# Patient Record
Sex: Male | Born: 1965
Health system: Southern US, Community
[De-identification: ages and names within clinical notes are randomized; demographics above are authoritative.]

## PROBLEM LIST (undated history)

## (undated) DIAGNOSIS — F329 Major depressive disorder, single episode, unspecified: Secondary | ICD-10-CM

## (undated) DIAGNOSIS — F419 Anxiety disorder, unspecified: Secondary | ICD-10-CM

## (undated) DIAGNOSIS — F32A Depression, unspecified: Secondary | ICD-10-CM

## (undated) DIAGNOSIS — I10 Essential (primary) hypertension: Secondary | ICD-10-CM

## (undated) DIAGNOSIS — E119 Type 2 diabetes mellitus without complications: Secondary | ICD-10-CM

## (undated) HISTORY — PX: APPENDECTOMY: SHX54

---

## 2005-04-14 ENCOUNTER — Emergency Department: Payer: Self-pay | Admitting: Unknown Physician Specialty

## 2014-06-11 ENCOUNTER — Emergency Department: Payer: Self-pay | Admitting: Emergency Medicine

## 2015-07-24 ENCOUNTER — Emergency Department: Payer: Self-pay

## 2015-07-24 ENCOUNTER — Emergency Department
Admission: EM | Admit: 2015-07-24 | Discharge: 2015-07-24 | Disposition: A | Discharge status: Home / Self Care | Payer: Self-pay | Attending: Emergency Medicine | Admitting: Emergency Medicine

## 2015-07-24 ENCOUNTER — Encounter: Payer: Self-pay | Admitting: Emergency Medicine

## 2015-07-24 DIAGNOSIS — R51 Headache: Secondary | ICD-10-CM | POA: Insufficient documentation

## 2015-07-24 DIAGNOSIS — I10 Essential (primary) hypertension: Secondary | ICD-10-CM | POA: Insufficient documentation

## 2015-07-24 DIAGNOSIS — E119 Type 2 diabetes mellitus without complications: Secondary | ICD-10-CM | POA: Insufficient documentation

## 2015-07-24 DIAGNOSIS — R519 Headache, unspecified: Secondary | ICD-10-CM

## 2015-07-24 HISTORY — DX: Essential (primary) hypertension: I10

## 2015-07-24 HISTORY — DX: Type 2 diabetes mellitus without complications: E11.9

## 2015-07-24 HISTORY — DX: Anxiety disorder, unspecified: F41.9

## 2015-07-24 MED ORDER — METOCLOPRAMIDE HCL 10 MG PO TABS: 10.0000 mg | ORAL_TABLET | Freq: Three times a day (TID) | ORAL | Status: DC | PRN

## 2015-07-24 MED ORDER — HYDROCHLOROTHIAZIDE 25 MG PO TABS: 25.0000 mg | ORAL_TABLET | Freq: Every day | ORAL | Status: AC

## 2015-07-24 MED ORDER — DIPHENHYDRAMINE HCL 25 MG PO CAPS: 50.0000 mg | ORAL_CAPSULE | Freq: Three times a day (TID) | ORAL | Status: DC | PRN

## 2015-07-24 NOTE — ED Provider Notes (Signed)
[facility] Emergency Department Provider Note  ____________________________________________  Time seen: 3:00 PM  I have reviewed the triage vital signs and the nursing notes.   HISTORY  Chief Complaint Headache    HPI Vincent Colon is a 50 y.o. male who complains of chronic right-sided headache for the past 3 weeks. It started after he hit his head on the door frame of his car when trying to lean in.  He had been having intermittent headaches after that, but they have become more frequent and more severe. Denies vision changes neck pain numbness tingling or weakness. No dizziness or syncope. Headache seems to be worse when first getting up in the morning. Has a history of hypertension and diabetes. Took himself off of his antihypertensive about a month ago because he could not afford losartan that his doctor had prescribed. He had been having cough with lisinopril.  No change in balance or coordination.   Past Medical History  Diagnosis Date  . Diabetes mellitus without complication (HCC)   . Hypertension   . Anxiety      There are no active problems to display for this patient.    Past Surgical History  Procedure Laterality Date  . Appendectomy       Current Outpatient Rx  Name  Route  Sig  Dispense  Refill  . diphenhydrAMINE (BENADRYL) 25 mg capsule   Oral   Take 2 capsules (50 mg total) by mouth every 8 (eight) hours as needed (with metoclopramid for headache).   60 capsule   0   . hydrochlorothiazide (HYDRODIURIL) 25 MG tablet   Oral   Take 1 tablet (25 mg total) by mouth daily.   30 tablet   0   . metoCLOPramide (REGLAN) 10 MG tablet   Oral   Take 1 tablet (10 mg total) by mouth every 8 (eight) hours as needed for nausea (headache).   60 tablet   0      Allergies Review of patient's allergies indicates no known allergies.   No family history on file.  Social History Social History  Substance Use Topics  . Smoking  status: Never Smoker   . Smokeless tobacco: None  . Alcohol Use: No    Review of Systems  Constitutional:   No fever or chills. No weight changes Eyes:   No vision changes.  ENT:   No sore throat. No rhinorrhea. Cardiovascular:   No chest pain. Respiratory:   No dyspnea or cough. Gastrointestinal:   Negative for abdominal pain, vomiting and diarrhea.  No BRBPR or melena. Genitourinary:   Negative for dysuria or difficulty urinating. Musculoskeletal:   Negative for focal pain or swelling Skin:   Negative for rash. Neurological:   Positive as above for chronic daily headache. No focal weakness or paresthesia.Marland Kitchen  10-point ROS otherwise negative.  ____________________________________________   PHYSICAL EXAM:  VITAL SIGNS: ED Triage Vitals  Enc Vitals Group     BP 07/24/15 1115 165/96 mmHg     Pulse Rate 07/24/15 1115 95     Resp 07/24/15 1115 14     Temp 07/24/15 1115 98.4 F (36.9 C)     Temp src --      SpO2 07/24/15 1115 96 %     Weight 07/24/15 1115 220 lb (99.791 kg)     Height 07/24/15 1115  (1.803 m)     Head Cir --      Peak Flow --      Pain Score  07/24/15 1116 10     Pain Loc --      Pain Edu? --      Excl. in GC? --     Vital signs reviewed, nursing assessments reviewed.   Constitutional:   Alert and oriented. Well appearing and in no distress. Eyes:   No scleral icterus. No conjunctival pallor. PERRL. EOMI, no nystagmus ENT   Head:   Normocephalic and atraumatic. Normal TMs bilaterally. Normal symmetric temporal artery pulsations.   Nose:   No congestion/rhinnorhea. No septal hematoma   Mouth/Throat:   MMM, no pharyngeal erythema. No peritonsillar mass.    Neck:   No stridor. No SubQ emphysema. No meningismus. No bruit Hematological/Lymphatic/Immunilogical:   No cervical lymphadenopathy. Cardiovascular:   RRR. Symmetric bilateral radial and DP pulses.  No murmurs.  Respiratory:   Normal respiratory effort without tachypnea nor  retractions. Breath sounds are clear and equal bilaterally. No wheezes/rales/rhonchi. Gastrointestinal:   Soft and nontender. Non distended. There is no CVA tenderness.  No rebound, rigidity, or guarding. Genitourinary:   deferred Musculoskeletal:   Nontender with normal range of motion in all extremities. No joint effusions.  No lower extremity tenderness.  No edema. Neurologic:   Normal speech and language.  CN 2-10 normal. Motor grossly intact. Normal ambulation. Normal coordination. No gross focal neurologic deficits are appreciated.  Skin:    Skin is warm, dry and intact. No rash noted.  No petechiae, purpura, or bullae. Psychiatric:   Mood and affect are normal. ____________________________________________    LABS (pertinent positives/negatives) (all labs ordered are listed, but only abnormal results are displayed) Labs Reviewed - No data to display ____________________________________________   EKG    ____________________________________________    RADIOLOGY  CT head unremarkable  ____________________________________________   PROCEDURES   ____________________________________________   INITIAL IMPRESSION / ASSESSMENT AND PLAN / ED COURSE  Pertinent labs & imaging results that were available during my care of the patient were reviewed by me and considered in my medical decision making (see chart for details).  Patient presents with chronic headache. Overall well-appearing. He also has some stage II hypertension which is currently not controlled due to discontinuation of his antihypertensives due to cost. I think the headache is likely related to a small degree to the hypertension but also to anxiety and to the blunt trauma that he sustained hitting his head against his car 3 weeks ago just before the onset. Low suspicion for stroke, intracranial hemorrhage, intracranial hypertension, glaucoma or temporal arteritis sinus thrombosis, meningitis or  encephalitis.  reglan for headache, hctz for htn. F/u pcp in 1 week.  Has seen Dr. Illene RegulusSelvidge regularly  in the past.     ____________________________________________   FINAL CLINICAL IMPRESSION(S) / ED DIAGNOSES  Final diagnoses:  Chronic nonintractable headache, unspecified headache type  Essential hypertension      Sharman CheekPhillip Erique Kaser, MD 07/24/15 [phone]

## 2015-07-24 NOTE — ED Notes (Signed)
Says headache right side head for couple weeks and getting worse.  Wakes up with sorness on head.  Pt is worried about an aneurism.

## 2015-07-24 NOTE — ED Notes (Signed)
E signature did not work,  D/c inst to pt.

## 2015-07-24 NOTE — Discharge Instructions (Signed)
General Headache Without Cause °A headache is pain or discomfort felt around the head or neck area. The specific cause of a headache may not be found. There are many causes and types of headaches. A few common ones are: °· Tension headaches. °· Migraine headaches. °· Cluster headaches. °· Chronic daily headaches. °HOME CARE INSTRUCTIONS  °Watch your condition for any changes. Take these steps to help with your condition: °Managing Pain °· Take over-the-counter and prescription medicines only as told by your health care provider. °· Lie down in a dark, quiet room when you have a headache. °· If directed, apply ice to the head and neck area: °· Put ice in a plastic bag. °· Place a towel between your skin and the bag. °· Leave the ice on for 20 minutes, 2-3 times per day. °· Use a heating pad or hot shower to apply heat to the head and neck area as told by your health care provider. °· Keep lights dim if bright lights bother you or make your headaches worse. °Eating and Drinking °· Eat meals on a regular schedule. °· Limit alcohol use. °· Decrease the amount of caffeine you drink, or stop drinking caffeine. °General Instructions °· Keep all follow-up visits as told by your health care provider. This is important. °· Keep a headache journal to help find out what may trigger your headaches. For example, write down: °· What you eat and drink. °· How much sleep you get. °· Any change to your diet or medicines. °· Try massage or other relaxation techniques. °· Limit stress. °· Sit up straight, and do not tense your muscles. °· Do not use tobacco products, including cigarettes, chewing tobacco, or e-cigarettes. If you need help quitting, ask your health care provider. °· Exercise regularly as told by your health care provider. °· Sleep on a regular schedule. Get 7-9 hours of sleep, or the amount recommended by your health care provider. °SEEK MEDICAL CARE IF:  °· Your symptoms are not helped by medicine. °· You have a  headache that is different from the usual headache. °· You have nausea or you vomit. °· You have a fever. °SEEK IMMEDIATE MEDICAL CARE IF:  °· Your headache becomes severe. °· You have repeated vomiting. °· You have a stiff neck. °· You have a loss of vision. °· You have problems with speech. °· You have pain in the eye or ear. °· You have muscular weakness or loss of muscle control. °· You lose your balance or have trouble walking. °· You feel faint or pass out. °· You have confusion. °  °This information is not intended to replace advice given to you by your health care provider. Make sure you discuss any questions you have with your health care provider. °  °Document Released: 04/08/2005 Document Revised: 12/28/2014 Document Reviewed: 08/01/2014 °Elsevier Interactive Patient Education ©2016 [employer] ° °Hypertension °Hypertension, commonly called high blood pressure, is when the force of blood pumping through your arteries is too strong. Your arteries are the blood vessels that carry blood from your heart throughout your body. A blood pressure reading consists of a higher number over a lower number, such as 110/72. The higher number (systolic) is the pressure inside your arteries when your heart pumps. The lower number (diastolic) is the pressure inside your arteries when your heart relaxes. Ideally you want your blood pressure below 120/80. °Hypertension forces your heart to work harder to pump blood. Your arteries may become narrow or stiff. Having untreated or   uncontrolled hypertension can cause heart attack, stroke, kidney disease, and other problems. °RISK FACTORS °Some risk factors for high blood pressure are controllable. Others are not.  °Risk factors you cannot control include:  °· Race. You may be at higher risk if you are African American. °· Age. Risk increases with age. °· Gender. Men are at higher risk than women before age 45 years. After age 65, women are at higher risk than men. °Risk factors  you can control include: °· Not getting enough exercise or physical activity. °· Being overweight. °· Getting too much fat, sugar, calories, or salt in your diet. °· Drinking too much alcohol. °SIGNS AND SYMPTOMS °Hypertension does not usually cause signs or symptoms. Extremely high blood pressure (hypertensive crisis) may cause headache, anxiety, shortness of breath, and nosebleed. °DIAGNOSIS °To check if you have hypertension, your health care provider will measure your blood pressure while you are seated, with your arm held at the level of your heart. It should be measured at least twice using the same arm. Certain conditions can cause a difference in blood pressure between your right and left arms. A blood pressure reading that is higher than normal on one occasion does not mean that you need treatment. If it is not clear whether you have high blood pressure, you may be asked to return on a different day to have your blood pressure checked again. Or, you may be asked to monitor your blood pressure at home for 1 or more weeks. °TREATMENT °Treating high blood pressure includes making lifestyle changes and possibly taking medicine. Living a healthy lifestyle can help lower high blood pressure. You may need to change some of your habits. °Lifestyle changes may include: °· Following the DASH diet. This diet is high in fruits, vegetables, and whole grains. It is low in salt, red meat, and added sugars. °· Keep your sodium intake below 2,300 mg per day. °· Getting at least 30-45 minutes of aerobic exercise at least 4 times per week. °· Losing weight if necessary. °· Not smoking. °· Limiting alcoholic beverages. °· Learning ways to reduce stress. °Your health care provider may prescribe medicine if lifestyle changes are not enough to get your blood pressure under control, and if one of the following is true: °· You are 18-59 years of age and your systolic blood pressure is above 140. °· You are 60 years of age or older,  and your systolic blood pressure is above 150. °· Your diastolic blood pressure is above 90. °· You have diabetes, and your systolic blood pressure is over 140 or your diastolic blood pressure is over 90. °· You have kidney disease and your blood pressure is above 140/90. °· You have heart disease and your blood pressure is above 140/90. °Your personal target blood pressure may vary depending on your medical conditions, your age, and other factors. °HOME CARE INSTRUCTIONS °· Have your blood pressure rechecked as directed by your health care provider.   °· Take medicines only as directed by your health care provider. Follow the directions carefully. Blood pressure medicines must be taken as prescribed. The medicine does not work as well when you skip doses. Skipping doses also puts you at risk for problems. °· Do not smoke.   °· Monitor your blood pressure at home as directed by your health care provider.  °SEEK MEDICAL CARE IF:  °· You think you are having a reaction to medicines taken. °· You have recurrent headaches or feel dizzy. °· You have swelling in your   ankles. °· You have trouble with your vision. °SEEK IMMEDIATE MEDICAL CARE IF: °· You develop a severe headache or confusion. °· You have unusual weakness, numbness, or feel faint. °· You have severe chest or abdominal pain. °· You vomit repeatedly. °· You have trouble breathing. °MAKE SURE YOU:  °· Understand these instructions. °· Will watch your condition. °· Will get help right away if you are not doing well or get worse. °  °This information is not intended to replace advice given to you by your health care provider. Make sure you discuss any questions you have with your health care provider. °  °Document Released: 04/08/2005 Document Revised: 08/23/2014 Document Reviewed: 01/29/2013 °Elsevier Interactive Patient Education ©2016 Elsevier Inc. ° °

## 2017-03-20 ENCOUNTER — Other Ambulatory Visit: Payer: Self-pay

## 2017-03-20 ENCOUNTER — Emergency Department: Payer: Managed Care, Other (non HMO)

## 2017-03-20 ENCOUNTER — Emergency Department
Admission: EM | Admit: 2017-03-20 | Discharge: 2017-03-20 | Disposition: A | Discharge status: Home / Self Care | Payer: Managed Care, Other (non HMO) | Attending: Emergency Medicine | Admitting: Emergency Medicine

## 2017-03-20 DIAGNOSIS — Z7984 Long term (current) use of oral hypoglycemic drugs: Secondary | ICD-10-CM | POA: Diagnosis not present

## 2017-03-20 DIAGNOSIS — M542 Cervicalgia: Secondary | ICD-10-CM | POA: Diagnosis present

## 2017-03-20 DIAGNOSIS — I10 Essential (primary) hypertension: Secondary | ICD-10-CM | POA: Insufficient documentation

## 2017-03-20 DIAGNOSIS — E119 Type 2 diabetes mellitus without complications: Secondary | ICD-10-CM | POA: Diagnosis not present

## 2017-03-20 DIAGNOSIS — R531 Weakness: Secondary | ICD-10-CM | POA: Diagnosis not present

## 2017-03-20 DIAGNOSIS — M5412 Radiculopathy, cervical region: Secondary | ICD-10-CM | POA: Diagnosis not present

## 2017-03-20 DIAGNOSIS — Z79899 Other long term (current) drug therapy: Secondary | ICD-10-CM | POA: Diagnosis not present

## 2017-03-20 LAB — BASIC METABOLIC PANEL
ANION GAP: 9 (ref 5–15)
BUN: 25 mg/dL — AB (ref 6–20)
CALCIUM: 9.2 mg/dL (ref 8.9–10.3)
CO2: 30 mmol/L (ref 22–32)
Chloride: 99 mmol/L — ABNORMAL LOW (ref 101–111)
Creatinine, Ser: 0.91 mg/dL (ref 0.61–1.24)
GFR calc Af Amer: >60 mL/min (ref >60)
GFR calc non Af Amer: >60 mL/min (ref >60)
Glucose, Bld: 193 mg/dL — ABNORMAL HIGH (ref 65–99)
POTASSIUM: 3.5 mmol/L (ref 3.5–5.1)
Sodium: 138 mmol/L (ref 135–145)

## 2017-03-20 LAB — CBC
HEMATOCRIT: 42.9 % (ref 40.0–52.0)
Hemoglobin: 14.1 g/dL (ref 13.0–18.0)
MCH: 29.6 pg (ref 26.0–34.0)
MCHC: 32.8 g/dL (ref 32.0–36.0)
MCV: 90.3 fL (ref 80.0–100.0)
PLATELETS: 273 10*3/uL (ref 150–440)
RBC: 4.76 MIL/uL (ref 4.40–5.90)
RDW: 13.1 % (ref 11.5–14.5)
WBC: 6.7 10*3/uL (ref 3.8–10.6)

## 2017-03-20 LAB — TROPONIN I: Troponin I: <0.03 ng/mL (ref <0.03)

## 2017-03-20 MED ORDER — LORAZEPAM 2 MG/ML IJ SOLN
1.0000 mg | Freq: Once | INTRAMUSCULAR | Status: AC
  Administered 2017-03-20: 1 mg via INTRAVENOUS
  Filled 2017-03-20: qty 1

## 2017-03-20 MED ORDER — PREDNISONE 50 MG PO TABS: 50.0000 mg | ORAL_TABLET | Freq: Every day | ORAL | 0 refills | Status: DC

## 2017-03-20 MED ORDER — LORAZEPAM 2 MG/ML IJ SOLN
1.0000 mg | Freq: Once | INTRAMUSCULAR | Status: AC
  Administered 2017-03-20: 1 mg via INTRAMUSCULAR

## 2017-03-20 MED ORDER — LORAZEPAM 2 MG/ML IJ SOLN
INTRAMUSCULAR | Status: AC
  Administered 2017-03-20: 1 mg via INTRAMUSCULAR
  Filled 2017-03-20: qty 1

## 2017-03-20 NOTE — ED Provider Notes (Signed)
[facility] Emergency Department Provider Note   ____________________________________________    I have reviewed the triage vital signs and the nursing notes.   HISTORY  Chief Complaint Hand weakness    HPI Vincent Colon is a 51 y.o. male who presents with primary complaint of weakness in his left hand.  He notes over the last week he has had occasional sharp pain from the side of his neck into his left superior anterior chest and into his left arm.  Over the last several days he has noticed weakness in his left hand and grip as well as strength in his arm.  No headache.  No nausea or vomiting.  Does have a history of diabetes and high blood pressure.  No other deficits.  No diaphoresis.  No shortness of breath.  No fevers or chills.  Past Medical History:  Diagnosis Date  . Anxiety   . Diabetes mellitus without complication (HCC)   . Hypertension     There are no active problems to display for this patient.   Past Surgical History:  Procedure Laterality Date  . APPENDECTOMY      Prior to Admission medications   Medication Sig Start Date End Date Taking? Authorizing Provider  hydrochlorothiazide (HYDRODIURIL) 25 MG tablet Take 1 tablet (25 mg total) by mouth daily. Patient taking differently: Take 12.5 mg by mouth daily.  07/24/15  Yes Sharman CheekStafford, Phillip, MD  metFORMIN (GLUCOPHAGE-XR) 500 MG 24 hr tablet Take 1,000 mg by mouth 2 (two) times daily.   Yes [provider]  diphenhydrAMINE (BENADRYL) 25 mg capsule Take 2 capsules (50 mg total) by mouth every 8 (eight) hours as needed (with metoclopramid for headache). Patient not taking: Reported on 03/20/2017 07/24/15   Sharman CheekStafford, Phillip, MD  metoCLOPramide (REGLAN) 10 MG tablet Take 1 tablet (10 mg total) by mouth every 8 (eight) hours as needed for nausea (headache). Patient not taking: Reported on 03/20/2017 07/24/15   Sharman CheekStafford, Phillip, MD  predniSONE (DELTASONE) 50 MG tablet Take 1 tablet  (50 mg total) by mouth daily with breakfast. 03/20/17   Jene EveryKinner, Breshae Belcher, MD     Allergies Patient has no known allergies.  No family history on file.  Social History Social History   Tobacco Use  . Smoking status: Never Smoker  . Smokeless tobacco: Never Used  Substance Use Topics  . Alcohol use: No  . Drug use: No    Review of Systems  Constitutional: No fever/chills Eyes: No visual changes.  ENT: No sore throat. Cardiovascular: Denies chest pain. Respiratory: Denies shortness of breath. Gastrointestinal: No abdominal pain.  No nausea, no vomiting.   Genitourinary: Negative for dysuria. Musculoskeletal: Negative for back pain. Skin: Negative for rash. Neurological: Negative for headaches    ____________________________________________   PHYSICAL EXAM:  VITAL SIGNS: ED Triage Vitals [03/20/17 0834]  Enc Vitals Group     BP (!) 171/110     Pulse Rate 99     Resp 18     Temp 98.2 F (36.8 C)     Temp Source Oral     SpO2 98 %     Weight 99.8 kg (220 lb)     Height 1.803 m (5\' 11" )     Head Circumference      Peak Flow      Pain Score 5     Pain Loc      Pain Edu?      Excl. in GC?     Constitutional: Alert  and oriented. No acute distress. Pleasant and interactive Eyes: Conjunctivae are normal.   Nose: No congestion/rhinnorhea. Mouth/Throat: Mucous membranes are moist.   Neck:  Painless ROM, no vertebral tenderness to palpation Cardiovascular: Normal rate, regular rhythm. Grossly normal heart sounds.  Good peripheral circulation. Respiratory: Normal respiratory effort.  No retractions. Lungs CTAB. Gastrointestinal: Soft and nontender. No distention.  No CVA tenderness. Genitourinary: deferred Musculoskeletal:   Warm and well perfused, objective weakness left grip strength, left biceps, deltoid Neurologic:  Normal speech and language. No gross focal neurologic deficits are appreciated.  Skin:  Skin is warm, dry and intact. No rash noted. Psychiatric:  Mood and affect are normal. Speech and behavior are normal.  ____________________________________________   LABS (all labs ordered are listed, but only abnormal results are displayed)  Labs Reviewed  BASIC METABOLIC PANEL - Abnormal; Notable for the following components:      Result Value   Chloride 99 (*)    Glucose, Bld 193 (*)    BUN 25 (*)    All other components within normal limits  CBC  TROPONIN I   ____________________________________________  EKG ED ECG REPORT I, Jene EveryKINNER, Makynlee Kressin, the attending physician, personally viewed and interpreted this ECG.  Date: 03/20/2017  Rhythm: normal sinus rhythm QRS Axis: normal Intervals: normal ST/T Wave abnormalities: normal Narrative Interpretation: no evidence of acute ischemia   ____________________________________________  RADIOLOGY  Chest x-ray normal MRI brain normal Spondylosis C5 through T1 ____________________________________________   PROCEDURES  Procedure(s) performed: No  Procedures   Critical Care performed: No ____________________________________________   INITIAL IMPRESSION / ASSESSMENT AND PLAN / ED COURSE  Pertinent labs & imaging results that were available during my care of the patient were reviewed by me and considered in my medical decision making (see chart for details).  Patient presents with subjective left arm weakness.  No other symptoms.  Gradual onset.  Not within stroke window and doubt CVA.  Suspect cervical radiculopathy.  MRI brain and cervical spine performed.  We will start the patient on prednisone and have him follow-up with neurosurgery for cervical radiculopathy    ____________________________________________   FINAL CLINICAL IMPRESSION(S) / ED DIAGNOSES  Final diagnoses:  Cervical radiculopathy        Note:  This document was prepared using Dragon voice recognition software and may include unintentional dictation errors.    Jene EveryKinner, Imogen Maddalena, MD 03/20/17  [id number]

## 2017-03-20 NOTE — ED Triage Notes (Signed)
Pt c/o left sided chest pain that radiates in the left arm with weakness in the hand grip for the past week. States it has just gotten progressively worse..Marland Kitchen

## 2017-04-05 ENCOUNTER — Encounter: Payer: Self-pay | Admitting: Emergency Medicine

## 2017-04-05 ENCOUNTER — Emergency Department
Admission: EM | Admit: 2017-04-05 | Discharge: 2017-04-05 | Disposition: A | Discharge status: Home / Self Care | Payer: Worker's Compensation | Attending: Internal Medicine | Admitting: Internal Medicine

## 2017-04-05 ENCOUNTER — Emergency Department: Payer: Worker's Compensation

## 2017-04-05 ENCOUNTER — Other Ambulatory Visit: Payer: Self-pay

## 2017-04-05 DIAGNOSIS — Y990 Civilian activity done for income or pay: Secondary | ICD-10-CM | POA: Diagnosis not present

## 2017-04-05 DIAGNOSIS — R0981 Nasal congestion: Secondary | ICD-10-CM

## 2017-04-05 DIAGNOSIS — S0003XA Contusion of scalp, initial encounter: Secondary | ICD-10-CM | POA: Diagnosis not present

## 2017-04-05 DIAGNOSIS — Y939 Activity, unspecified: Secondary | ICD-10-CM | POA: Insufficient documentation

## 2017-04-05 DIAGNOSIS — Z79899 Other long term (current) drug therapy: Secondary | ICD-10-CM | POA: Diagnosis not present

## 2017-04-05 DIAGNOSIS — I10 Essential (primary) hypertension: Secondary | ICD-10-CM | POA: Insufficient documentation

## 2017-04-05 DIAGNOSIS — Y929 Unspecified place or not applicable: Secondary | ICD-10-CM | POA: Diagnosis not present

## 2017-04-05 DIAGNOSIS — S0990XA Unspecified injury of head, initial encounter: Secondary | ICD-10-CM | POA: Diagnosis present

## 2017-04-05 DIAGNOSIS — W228XXA Striking against or struck by other objects, initial encounter: Secondary | ICD-10-CM | POA: Diagnosis not present

## 2017-04-05 DIAGNOSIS — E119 Type 2 diabetes mellitus without complications: Secondary | ICD-10-CM | POA: Diagnosis not present

## 2017-04-05 DIAGNOSIS — Z7984 Long term (current) use of oral hypoglycemic drugs: Secondary | ICD-10-CM | POA: Insufficient documentation

## 2017-04-05 MED ORDER — BUTALBITAL-APAP-CAFFEINE 50-325-40 MG PO TABS: 1.0000 | ORAL_TABLET | Freq: Four times a day (QID) | ORAL | 0 refills | Status: DC | PRN

## 2017-04-05 MED ORDER — IBUPROFEN 800 MG PO TABS
800.0000 mg | ORAL_TABLET | Freq: Once | ORAL | Status: AC
  Administered 2017-04-05: 800 mg via ORAL
  Filled 2017-04-05: qty 1

## 2017-04-05 MED ORDER — TRAMADOL HCL 50 MG PO TABS
50.0000 mg | ORAL_TABLET | Freq: Once | ORAL | Status: AC
  Administered 2017-04-05: 50 mg via ORAL
  Filled 2017-04-05: qty 1

## 2017-04-05 MED ORDER — FEXOFENADINE-PSEUDOEPHED ER 60-120 MG PO TB12: 1.0000 | ORAL_TABLET | Freq: Two times a day (BID) | ORAL | 0 refills | Status: DC

## 2017-04-05 NOTE — ED Triage Notes (Addendum)
[facility] from [facility] for workers comp accident. Pt was unloading a truck when crates fell hitting him on the head. Pt states that he did not pass out. Pt works at [employer]. Pt in NAD at this time. Pt states that he has slight headache and pressure behind his eyes.

## 2017-04-05 NOTE — ED Notes (Signed)
Pt ambulatory to wheel chair upon discharge. Verbalized understanding of discharge instructions, prescriptions and follow-up care. Pt VSS. A&O x4. Skin warm and dry.

## 2017-04-05 NOTE — ED Provider Notes (Signed)
[facility] Emergency Department Provider Note   ____________________________________________   First MD Initiated Contact with Patient 04/05/17 1615     (approximate)  I have reviewed the triage vital signs and the nursing notes.   HISTORY  Chief Complaint workers comp    HPI Vincent Colon is a 51 y.o. male patient arrived via EMS complaining of headache and blurry vision secondary to contusion to the head. Patient state he was unloading the truck when it creates fell hitting him in the back of the head. Patient denies LOC. Patient states headache is increased along with pressure behind his right eye. Patient denies nausea or vertigo.   Past Medical History:  Diagnosis Date  . Anxiety   . Diabetes mellitus without complication (HCC)   . Hypertension     There are no active problems to display for this patient.   Past Surgical History:  Procedure Laterality Date  . APPENDECTOMY      Prior to Admission medications   Medication Sig Start Date End Date Taking? Authorizing Provider  butalbital-acetaminophen-caffeine (FIORICET, ESGIC) [phone] MG tablet Take 1-2 tablets by mouth every 6 (six) hours as needed for headache. 04/05/17 04/05/18  Joni ReiningSmith, Hala Narula K, PA-C  diphenhydrAMINE (BENADRYL) 25 mg capsule Take 2 capsules (50 mg total) by mouth every 8 (eight) hours as needed (with metoclopramid for headache). Patient not taking: Reported on 03/20/2017 07/24/15   Sharman CheekStafford, Phillip, MD  fexofenadine-pseudoephedrine (ALLEGRA-D) 60-120 MG 12 hr tablet Take 1 tablet by mouth 2 (two) times daily. 04/05/17   Joni ReiningSmith, Miyeko Mahlum K, PA-C  hydrochlorothiazide (HYDRODIURIL) 25 MG tablet Take 1 tablet (25 mg total) by mouth daily. Patient taking differently: Take 12.5 mg by mouth daily.  07/24/15   Sharman CheekStafford, Phillip, MD  metFORMIN (GLUCOPHAGE-XR) 500 MG 24 hr tablet Take 1,000 mg by mouth 2 (two) times daily.    [provider]  metoCLOPramide (REGLAN) 10 MG  tablet Take 1 tablet (10 mg total) by mouth every 8 (eight) hours as needed for nausea (headache). Patient not taking: Reported on 03/20/2017 07/24/15   Sharman CheekStafford, Phillip, MD  predniSONE (DELTASONE) 50 MG tablet Take 1 tablet (50 mg total) by mouth daily with breakfast. 03/20/17   Jene EveryKinner, Robert, MD    Allergies Patient has no known allergies.  No family history on file.  Social History Social History   Tobacco Use  . Smoking status: Never Smoker  . Smokeless tobacco: Never Used  Substance Use Topics  . Alcohol use: No  . Drug use: No    Review of Systems Constitutional: No fever/chills Eyes: Blurry vision. ENT: No sore throat. Cardiovascular: Denies chest pain. Respiratory: Denies shortness of breath. Gastrointestinal: No abdominal pain.  No nausea, no vomiting.  No diarrhea.  No constipation. Genitourinary: Negative for dysuria. Musculoskeletal: Negative for back pain. Skin: Negative for rash. Neurological: Positive for headaches, but denies focal weakness or numbness. Psychiatric:Anxiety Endocrine:Diabetes and hypertension ____________________________________________   PHYSICAL EXAM:  VITAL SIGNS: ED Triage Vitals  Enc Vitals Group     BP 04/05/17 1544 (!) 151/95     Pulse Rate 04/05/17 1544 (!) 105     Resp 04/05/17 1544 16     Temp 04/05/17 1544 98.6 F (37 C)     Temp Source 04/05/17 1544 Oral     SpO2 04/05/17 1544 97 %     Weight --      Height --      Head Circumference --      Peak Flow --  Pain Score 04/05/17 1543 4     Pain Loc --      Pain Edu? --      Excl. in GC? --    Constitutional: Alert and oriented. Well appearing and in no acute distress. Eyes: Conjunctivae are normal. PERRL. EOMI. Head: Atraumatic. Nose: Bilateral maxillary guarding. Mouth/Throat: Mucous membranes are moist.  Oropharynx non-erythematous. Neck: No stridor.  No cervical spine tenderness to palpation. Cardiovascular: Tachycardic. Grossly normal heart sounds.  Good  peripheral circulation. Respiratory: Normal respiratory effort.  No retractions. Lungs CTAB. Gastrointestinal: Soft and nontender. No distention. No abdominal bruits. No CVA tenderness. Skin:  Skin is warm, dry and intact. No rash noted. Psychiatric: Mood and affect are normal. Speech and behavior are normal.  ____________________________________________   LABS (all labs ordered are listed, but only abnormal results are displayed)  Labs Reviewed - No data to display ____________________________________________  EKG   ____________________________________________  RADIOLOGY  Ct Head Wo Contrast  Result Date: 04/05/2017 CLINICAL DATA:  51 year old male presents after fall hitting his head after shelving fell onto patient. EXAM: CT HEAD WITHOUT CONTRAST TECHNIQUE: Contiguous axial images were obtained from the base of the skull through the vertex without intravenous contrast. COMPARISON:  07/24/2015 head CT, MRI 03/20/2017. FINDINGS: BRAIN: The ventricles and sulci are normal. Normal variant cavum septi pellucidi and vergae. No intraparenchymal hemorrhage, mass effect nor midline shift. No acute large vascular territory infarcts. Grey-white matter distinction is maintained. The basal ganglia are unremarkable. No abnormal extra-axial fluid collections. Basal cisterns are not effaced and midline. The brainstem and cerebellar hemispheres are without acute abnormalities. VASCULAR: Unremarkable. SKULL/SOFT TISSUES: No skull fracture. No significant soft tissue swelling. ORBITS/SINUSES: The included ocular globes and orbital contents are normal.The mastoid air cells are clear. The included paranasal sinuses are well-aerated. OTHER: None. IMPRESSION: Normal head CT Electronically Signed   By: Tollie Ethavid  Kwon M.D.   On: 04/05/2017 17:22    ____________________________________________   PROCEDURES  Procedure(s) performed: None  Procedures  Critical Care performed:  No  ____________________________________________   INITIAL IMPRESSION / ASSESSMENT AND PLAN / ED COURSE  As part of my medical decision making, I reviewed the following data within the electronic MEDICAL RECORD NUMBER    Patient's present headache and right eye pressure secondary to scalp contusion. Discussed negative head CT findings with patient. Patient given discharge care instructions. Patient given a return to work note. Patient passed take medication as needed for headache.      ____________________________________________   FINAL CLINICAL IMPRESSION(S) / ED DIAGNOSES  Final diagnoses:  Contusion of scalp, initial encounter  Sinus congestion     ED Discharge Orders        Ordered    butalbital-acetaminophen-caffeine (FIORICET, ESGIC) 50-325-40 MG tablet  Every 6 hours PRN     04/05/17 1738    fexofenadine-pseudoephedrine (ALLEGRA-D) 60-120 MG 12 hr tablet  2 times daily     04/05/17 1738       Note:  This document was prepared using Dragon voice recognition software and may include unintentional dictation errors.    Joni ReiningSmith, Alfonza Toft K, PA-C 04/05/17 1743    Emily FilbertWilliams, Jonathan E, MD 04/09/17 [phone]

## 2017-04-05 NOTE — ED Notes (Signed)
This tech called contact Patterson HammersmithLisa Torrance [phone], and Serita Kyleedwina Collins [phone] no answer on either phone, also called the corp office [phone], for pt workmans comp info due to the profile sheet for [employer] saids urine drug screen :only upon request; breath analysis:only upon request

## 2017-04-05 NOTE — ED Notes (Addendum)
Pt had injury today while at work when shelving fell on his head then he fell back and hit his head. Per pt, Omelia BlackwaterDonna Lee is supervisor. Ms. Nedra HaiLee tried to catch pt but was unsuccessful. Pt reports falling and hitting the back of his head on the side of the trailer after the shelf hit him. Pt states he did not lose consciousness but felt blurry at the time of the accident. Pt reports 5/10 throbbing, shooting pain at the back of his head with a throbbing pressure behind right eye.

## 2017-04-07 ENCOUNTER — Emergency Department
Admission: EM | Admit: 2017-04-07 | Discharge: 2017-04-07 | Disposition: A | Discharge status: Home / Self Care | Payer: Worker's Compensation | Attending: Emergency Medicine | Admitting: Emergency Medicine

## 2017-04-07 ENCOUNTER — Encounter: Payer: Self-pay | Admitting: Emergency Medicine

## 2017-04-07 ENCOUNTER — Other Ambulatory Visit: Payer: Self-pay

## 2017-04-07 DIAGNOSIS — W2209XD Striking against other stationary object, subsequent encounter: Secondary | ICD-10-CM | POA: Insufficient documentation

## 2017-04-07 DIAGNOSIS — Z7984 Long term (current) use of oral hypoglycemic drugs: Secondary | ICD-10-CM | POA: Insufficient documentation

## 2017-04-07 DIAGNOSIS — S060X0D Concussion without loss of consciousness, subsequent encounter: Secondary | ICD-10-CM | POA: Insufficient documentation

## 2017-04-07 DIAGNOSIS — E119 Type 2 diabetes mellitus without complications: Secondary | ICD-10-CM | POA: Diagnosis not present

## 2017-04-07 DIAGNOSIS — S0990XD Unspecified injury of head, subsequent encounter: Secondary | ICD-10-CM | POA: Diagnosis present

## 2017-04-07 DIAGNOSIS — Y9389 Activity, other specified: Secondary | ICD-10-CM | POA: Insufficient documentation

## 2017-04-07 DIAGNOSIS — Z79899 Other long term (current) drug therapy: Secondary | ICD-10-CM | POA: Diagnosis not present

## 2017-04-07 DIAGNOSIS — I10 Essential (primary) hypertension: Secondary | ICD-10-CM | POA: Diagnosis not present

## 2017-04-07 MED ORDER — METOCLOPRAMIDE HCL 10 MG PO TABS: 10.0000 mg | ORAL_TABLET | Freq: Four times a day (QID) | ORAL | 0 refills | Status: DC | PRN

## 2017-04-07 NOTE — ED Triage Notes (Signed)
Pt returns following head injury on Saturday. He was at work and was hit in the head by a tray. He states he fell backwards after being hit and hit his head again on a metal wall. States "I did not lose complete consciousness but I blacked out." Pt states he is dissatisfied with the care he received on Saturday and wants to be seen again, as his pain continues.

## 2017-04-07 NOTE — ED Notes (Signed)
FIRST NURSE NOTE-pt very rude on entering not wanting to explain to RN why here.  Ambulatory without distress. Steady gait. Speech clear. No distress noted.

## 2017-04-07 NOTE — ED Notes (Signed)
No bloodwork per Dr. Manson PasseyBrown.

## 2017-04-07 NOTE — ED Provider Notes (Signed)
[facility] Emergency Department Provider Note  Time seen: 9:47 AM  I have reviewed the triage vital signs and the nursing notes.   HISTORY  Chief Complaint Head Injury    HPI Vincent Colon is a 51 y.o. male with a past medical history of diabetes, anxiety, hypertension presents to the emergency department with a continued headache.  According to the patient on Saturday he was helping loading a truck when he hit the front of his head causing him to fall backwards hitting the back of his head.  Denies complete loss of consciousness.  Patient was seen in the emergency department after the accident, had a workup including a CT scan of the head that was normal/negative.  Patient states since the accident he is continued to have a headache, has not been able to sleep much and feels out of it and off balance at times.  Denies any focal weakness or numbness.  Denies confusion.  Patient states he feels like he is very slow and off balance.  Past Medical History:  Diagnosis Date  . Anxiety   . Diabetes mellitus without complication (HCC)   . Hypertension     There are no active problems to display for this patient.   Past Surgical History:  Procedure Laterality Date  . APPENDECTOMY      Prior to Admission medications   Medication Sig Start Date End Date Taking? Authorizing Provider  butalbital-acetaminophen-caffeine (FIORICET, ESGIC) [phone] MG tablet Take 1-2 tablets by mouth every 6 (six) hours as needed for headache. 04/05/17 04/05/18  Joni ReiningSmith, Ronald K, PA-C  diphenhydrAMINE (BENADRYL) 25 mg capsule Take 2 capsules (50 mg total) by mouth every 8 (eight) hours as needed (with metoclopramid for headache). Patient not taking: Reported on 03/20/2017 07/24/15   Sharman CheekStafford, Phillip, MD  fexofenadine-pseudoephedrine (ALLEGRA-D) 60-120 MG 12 hr tablet Take 1 tablet by mouth 2 (two) times daily. 04/05/17   Joni ReiningSmith, Ronald K, PA-C  hydrochlorothiazide (HYDRODIURIL) 25 MG  tablet Take 1 tablet (25 mg total) by mouth daily. Patient taking differently: Take 12.5 mg by mouth daily.  07/24/15   Sharman CheekStafford, Phillip, MD  metFORMIN (GLUCOPHAGE-XR) 500 MG 24 hr tablet Take 1,000 mg by mouth 2 (two) times daily.    [provider]  metoCLOPramide (REGLAN) 10 MG tablet Take 1 tablet (10 mg total) by mouth every 8 (eight) hours as needed for nausea (headache). Patient not taking: Reported on 03/20/2017 07/24/15   Sharman CheekStafford, Phillip, MD  predniSONE (DELTASONE) 50 MG tablet Take 1 tablet (50 mg total) by mouth daily with breakfast. 03/20/17   Jene EveryKinner, Robert, MD    No Known Allergies  History reviewed. No pertinent family history.  Social History Social History   Tobacco Use  . Smoking status: Never Smoker  . Smokeless tobacco: Never Used  Substance Use Topics  . Alcohol use: No  . Drug use: No    Review of Systems Constitutional: Negative for fever. Cardiovascular: Negative for chest pain. Respiratory: Negative for shortness of breath. Gastrointestinal: Negative for abdominal pain.  States some nausea but denies vomiting. Neurological: Moderate headache.  Denies any focal weakness or numbness. All other ROS negative  ____________________________________________   PHYSICAL EXAM:  VITAL SIGNS: ED Triage Vitals  Enc Vitals Group     BP 04/07/17 0811 (!) 197/101     Pulse Rate 04/07/17 0811 (!) 103     Resp 04/07/17 0811 18     Temp 04/07/17 0811 98.1 F (36.7 C)     Temp  Source 04/07/17 0811 Oral     SpO2 04/07/17 0811 99 %     Weight 04/07/17 0812 217 lb (98.4 kg)     Height 04/07/17 0812 5\' 11"  (1.803 m)     Head Circumference --      Peak Flow --      Pain Score 04/07/17 0810 10     Pain Loc --      Pain Edu? --      Excl. in GC? --    Constitutional: Alert and oriented. Well appearing and in no distress. Eyes: Normal exam ENT   Head: Normocephalic and atraumatic   Mouth/Throat: Mucous membranes are moist. Cardiovascular: Normal  rate, regular rhythm. No murmur Respiratory: Normal respiratory effort without tachypnea nor retractions. Breath sounds are clear  Gastrointestinal: Soft and nontender. No distention.  Musculoskeletal: Nontender with normal range of motion in all extremities. Neurologic:  Normal speech and language.  Slightly decreased grip strength in left upper extremity which the patient states is due to spinal stenosis.  No pronator drift.  Cranial nerves intact, ambulates without difficulty. Skin:  Skin is warm, dry and intact.  Psychiatric: Mood and affect are normal.   ____________________________________________   INITIAL IMPRESSION / ASSESSMENT AND PLAN / ED COURSE  Pertinent labs & imaging results that were available during my care of the patient were reviewed by me and considered in my medical decision making (see chart for details).  Patient presents to the emergency department for repeat evaluation given continued headache and feeling of being off balance since a head injury 2 days ago.  Differential would include concussion, migraine, delayed ICH.  Given the patient's continued headache I discussed repeat CT imaging to rule out any delayed bleed although my clinical suspicion is extremely low.  Regardless patient states he does not want to proceed with a CT scan as he has had multiple recent x-rays in addition to the recent CT scan and he is worried about the radiation exposure he is experiencing.  Denies any vomiting or loss of consciousness.  Patient symptoms are most consistent with postconcussive syndrome.  I discussed with the patient the importance of obtaining a funny sleep and rest as well as dark quiet spaces.  Patient states he was written for Fioricet but was told by his doctor not to take Tylenol so he has not taken the medication.  I discussed with the patient using ibuprofen 400 mg as well as Benadryl 50 mg as needed for headache which will also allow him to get some sleep.  We will also  write the patient for Reglan.  I provided my normal headache return precautions.  Overall the patient appears well, no distress and I believe he is safe for discharge home.  ____________________________________________   FINAL CLINICAL IMPRESSION(S) / ED DIAGNOSES  Concussion Headache    Minna AntisPaduchowski, Elihue Ebert, MD 04/07/17 [phone]

## 2017-04-07 NOTE — Discharge Instructions (Signed)
As we discussed please take 400 mg of ibuprofen, 50 mg of Benadryl and 20 mg of Reglan, as needed for headache every 8 hours.  Do not drink alcohol or drive while taking these medications.  Please obtain plenty of rest, return to the emergency department for any worsening headache, confusion, slurred speech or weakness/numbness of any arm or leg.  Otherwise please call the number provided for neurology to arrange a follow-up appointment for your likely postconcussive syndrome.

## 2017-04-08 ENCOUNTER — Other Ambulatory Visit: Payer: Self-pay | Admitting: Neurosurgery

## 2017-04-08 DIAGNOSIS — M5412 Radiculopathy, cervical region: Secondary | ICD-10-CM

## 2017-04-16 ENCOUNTER — Encounter: Payer: Self-pay | Admitting: Neurosurgery

## 2017-04-16 ENCOUNTER — Ambulatory Visit
Admission: RE | Admit: 2017-04-16 | Discharge: 2017-04-16 | Disposition: A | Discharge status: Home / Self Care | Payer: Managed Care, Other (non HMO) | Source: Ambulatory Visit | Attending: Neurosurgery | Admitting: Neurosurgery

## 2017-04-16 DIAGNOSIS — M4803 Spinal stenosis, cervicothoracic region: Secondary | ICD-10-CM | POA: Insufficient documentation

## 2017-04-16 DIAGNOSIS — M5412 Radiculopathy, cervical region: Secondary | ICD-10-CM | POA: Insufficient documentation

## 2017-04-16 DIAGNOSIS — M4802 Spinal stenosis, cervical region: Secondary | ICD-10-CM | POA: Diagnosis not present

## 2017-04-16 DIAGNOSIS — M50322 Other cervical disc degeneration at C5-C6 level: Secondary | ICD-10-CM | POA: Insufficient documentation

## 2017-04-25 ENCOUNTER — Emergency Department
Admission: EM | Admit: 2017-04-25 | Discharge: 2017-04-25 | Disposition: A | Discharge status: Other Acute Inpatient Hospital | Payer: BLUE CROSS/BLUE SHIELD | Attending: Emergency Medicine | Admitting: Emergency Medicine

## 2017-04-25 ENCOUNTER — Inpatient Hospital Stay
Admission: AD | Admit: 2017-04-25 | Discharge: 2017-04-28 | DRG: 885 | Disposition: A | Discharge status: Home / Self Care | Payer: No Typology Code available for payment source | Source: Intra-hospital | Attending: Psychiatry | Admitting: Psychiatry

## 2017-04-25 ENCOUNTER — Encounter: Payer: Self-pay | Admitting: Psychiatry

## 2017-04-25 ENCOUNTER — Encounter: Payer: Self-pay | Admitting: Emergency Medicine

## 2017-04-25 ENCOUNTER — Other Ambulatory Visit: Payer: Self-pay

## 2017-04-25 DIAGNOSIS — R519 Headache, unspecified: Secondary | ICD-10-CM

## 2017-04-25 DIAGNOSIS — G4700 Insomnia, unspecified: Secondary | ICD-10-CM | POA: Diagnosis present

## 2017-04-25 DIAGNOSIS — Z62810 Personal history of physical and sexual abuse in childhood: Secondary | ICD-10-CM | POA: Diagnosis not present

## 2017-04-25 DIAGNOSIS — F329 Major depressive disorder, single episode, unspecified: Secondary | ICD-10-CM | POA: Insufficient documentation

## 2017-04-25 DIAGNOSIS — Z7984 Long term (current) use of oral hypoglycemic drugs: Secondary | ICD-10-CM

## 2017-04-25 DIAGNOSIS — W228XXD Striking against or struck by other objects, subsequent encounter: Secondary | ICD-10-CM | POA: Diagnosis not present

## 2017-04-25 DIAGNOSIS — F5105 Insomnia due to other mental disorder: Secondary | ICD-10-CM | POA: Diagnosis not present

## 2017-04-25 DIAGNOSIS — R51 Headache: Secondary | ICD-10-CM

## 2017-04-25 DIAGNOSIS — E119 Type 2 diabetes mellitus without complications: Secondary | ICD-10-CM | POA: Diagnosis present

## 2017-04-25 DIAGNOSIS — I10 Essential (primary) hypertension: Secondary | ICD-10-CM | POA: Diagnosis present

## 2017-04-25 DIAGNOSIS — F4310 Post-traumatic stress disorder, unspecified: Secondary | ICD-10-CM | POA: Diagnosis present

## 2017-04-25 DIAGNOSIS — Z79899 Other long term (current) drug therapy: Secondary | ICD-10-CM | POA: Diagnosis not present

## 2017-04-25 DIAGNOSIS — R45851 Suicidal ideations: Secondary | ICD-10-CM | POA: Diagnosis present

## 2017-04-25 DIAGNOSIS — F0781 Postconcussional syndrome: Secondary | ICD-10-CM | POA: Diagnosis not present

## 2017-04-25 DIAGNOSIS — F419 Anxiety disorder, unspecified: Secondary | ICD-10-CM | POA: Insufficient documentation

## 2017-04-25 DIAGNOSIS — F409 Phobic anxiety disorder, unspecified: Secondary | ICD-10-CM | POA: Diagnosis not present

## 2017-04-25 DIAGNOSIS — F332 Major depressive disorder, recurrent severe without psychotic features: Principal | ICD-10-CM | POA: Diagnosis present

## 2017-04-25 DIAGNOSIS — F32A Depression, unspecified: Secondary | ICD-10-CM

## 2017-04-25 HISTORY — DX: Major depressive disorder, single episode, unspecified: F32.9

## 2017-04-25 HISTORY — DX: Depression, unspecified: F32.A

## 2017-04-25 LAB — CBC
HEMATOCRIT: 44.6 % (ref 40.0–52.0)
HEMOGLOBIN: 14.9 g/dL (ref 13.0–18.0)
MCH: 30.3 pg (ref 26.0–34.0)
MCHC: 33.4 g/dL (ref 32.0–36.0)
MCV: 90.7 fL (ref 80.0–100.0)
Platelets: 257 10*3/uL (ref 150–440)
RBC: 4.92 MIL/uL (ref 4.40–5.90)
RDW: 12.9 % (ref 11.5–14.5)
WBC: 5.6 10*3/uL (ref 3.8–10.6)

## 2017-04-25 LAB — HEMOGLOBIN A1C
Hgb A1c MFr Bld: 8.4 % — ABNORMAL HIGH (ref 4.8–5.6)
Mean Plasma Glucose: 194.38 mg/dL

## 2017-04-25 LAB — URINE DRUG SCREEN, QUALITATIVE (ARMC ONLY)
Amphetamines, Ur Screen: NOT DETECTED
BARBITURATES, UR SCREEN: POSITIVE — AB
Benzodiazepine, Ur Scrn: NOT DETECTED
COCAINE METABOLITE, UR ~~LOC~~: NOT DETECTED
Cannabinoid 50 Ng, Ur ~~LOC~~: NOT DETECTED
MDMA (Ecstasy)Ur Screen: NOT DETECTED
METHADONE SCREEN, URINE: NOT DETECTED
OPIATE, UR SCREEN: NOT DETECTED
PHENCYCLIDINE (PCP) UR S: NOT DETECTED
Tricyclic, Ur Screen: POSITIVE — AB

## 2017-04-25 LAB — COMPREHENSIVE METABOLIC PANEL
ALBUMIN: 4.4 g/dL (ref 3.5–5.0)
ALK PHOS: 58 U/L (ref 38–126)
ALT: 14 U/L — AB (ref 17–63)
AST: 19 U/L (ref 15–41)
Anion gap: 10 (ref 5–15)
BILIRUBIN TOTAL: 0.9 mg/dL (ref 0.3–1.2)
BUN: 15 mg/dL (ref 6–20)
CALCIUM: 9.3 mg/dL (ref 8.9–10.3)
CO2: 28 mmol/L (ref 22–32)
CREATININE: 0.76 mg/dL (ref 0.61–1.24)
Chloride: 99 mmol/L — ABNORMAL LOW (ref 101–111)
GFR calc Af Amer: >60 mL/min (ref >60)
GFR calc non Af Amer: >60 mL/min (ref >60)
GLUCOSE: 230 mg/dL — AB (ref 65–99)
Potassium: 3.7 mmol/L (ref 3.5–5.1)
SODIUM: 137 mmol/L (ref 135–145)
TOTAL PROTEIN: 7.8 g/dL (ref 6.5–8.1)

## 2017-04-25 LAB — ACETAMINOPHEN LEVEL

## 2017-04-25 LAB — ETHANOL: Alcohol, Ethyl (B): <10 mg/dL (ref <10)

## 2017-04-25 LAB — GLUCOSE, CAPILLARY: Glucose-Capillary: 135 mg/dL — ABNORMAL HIGH (ref 65–99)

## 2017-04-25 LAB — SALICYLATE LEVEL: Salicylate Lvl: <7 mg/dL (ref 2.8–30.0)

## 2017-04-25 MED ORDER — METFORMIN HCL 500 MG PO TABS
1000.0000 mg | ORAL_TABLET | Freq: Two times a day (BID) | ORAL | Status: DC
  Administered 2017-04-26 – 2017-04-28 (×5): 1000 mg via ORAL
  Filled 2017-04-25 (×5): qty 2

## 2017-04-25 MED ORDER — ACETAMINOPHEN 325 MG PO TABS: 650.0000 mg | ORAL_TABLET | Freq: Four times a day (QID) | ORAL | Status: DC | PRN

## 2017-04-25 MED ORDER — HYDROCHLOROTHIAZIDE 25 MG PO TABS: 25.0000 mg | ORAL_TABLET | Freq: Every day | ORAL | Status: DC

## 2017-04-25 MED ORDER — MIRTAZAPINE 15 MG PO TABS
15.0000 mg | ORAL_TABLET | Freq: Every day | ORAL | Status: DC
  Administered 2017-04-26 – 2017-04-27 (×2): 15 mg via ORAL
  Filled 2017-04-25 (×2): qty 1

## 2017-04-25 MED ORDER — MIRTAZAPINE 15 MG PO TABS
15.0000 mg | ORAL_TABLET | Freq: Every day | ORAL | Status: DC
  Administered 2017-04-25: 15 mg via ORAL
  Filled 2017-04-25: qty 1

## 2017-04-25 MED ORDER — MAGNESIUM HYDROXIDE 400 MG/5ML PO SUSP: 30.0000 mL | Freq: Every day | ORAL | Status: DC | PRN

## 2017-04-25 MED ORDER — HYDROXYZINE HCL 25 MG PO TABS: 25.0000 mg | ORAL_TABLET | Freq: Three times a day (TID) | ORAL | Status: DC | PRN

## 2017-04-25 MED ORDER — ELETRIPTAN HYDROBROMIDE 20 MG PO TABS
20.0000 mg | ORAL_TABLET | ORAL | Status: DC | PRN
  Filled 2017-04-25: qty 1

## 2017-04-25 MED ORDER — ALUM & MAG HYDROXIDE-SIMETH 200-200-20 MG/5ML PO SUSP: 30.0000 mL | ORAL | Status: DC | PRN

## 2017-04-25 MED ORDER — HYDROCHLOROTHIAZIDE 25 MG PO TABS
25.0000 mg | ORAL_TABLET | Freq: Once | ORAL | Status: AC
  Administered 2017-04-25: 25 mg via ORAL
  Filled 2017-04-25: qty 1

## 2017-04-25 MED ORDER — TRAZODONE HCL 100 MG PO TABS
100.0000 mg | ORAL_TABLET | Freq: Every evening | ORAL | Status: DC | PRN
  Administered 2017-04-26 – 2017-04-27 (×2): 100 mg via ORAL
  Filled 2017-04-25 (×2): qty 1

## 2017-04-25 MED ORDER — METFORMIN HCL 500 MG PO TABS
1000.0000 mg | ORAL_TABLET | Freq: Two times a day (BID) | ORAL | Status: DC
  Administered 2017-04-25: 1000 mg via ORAL
  Filled 2017-04-25: qty 2

## 2017-04-25 MED ORDER — INSULIN ASPART 100 UNIT/ML ~~LOC~~ SOLN
0.0000 [IU] | Freq: Three times a day (TID) | SUBCUTANEOUS | Status: DC
  Administered 2017-04-26: 3 [IU] via SUBCUTANEOUS
  Administered 2017-04-27: 2 [IU] via SUBCUTANEOUS
  Administered 2017-04-27: 8 [IU] via SUBCUTANEOUS
  Administered 2017-04-28: 2 [IU] via SUBCUTANEOUS
  Administered 2017-04-28: 5 [IU] via SUBCUTANEOUS
  Filled 2017-04-25: qty 1

## 2017-04-25 MED ORDER — IBUPROFEN 200 MG PO TABS: 400.0000 mg | ORAL_TABLET | Freq: Four times a day (QID) | ORAL | Status: DC | PRN

## 2017-04-25 MED ORDER — INSULIN ASPART 100 UNIT/ML ~~LOC~~ SOLN: 0.0000 [IU] | Freq: Three times a day (TID) | SUBCUTANEOUS | Status: DC

## 2017-04-25 MED ORDER — HYDROCHLOROTHIAZIDE 25 MG PO TABS
25.0000 mg | ORAL_TABLET | Freq: Every day | ORAL | Status: DC
  Administered 2017-04-26 – 2017-04-28 (×3): 25 mg via ORAL
  Filled 2017-04-25 (×3): qty 1

## 2017-04-25 MED ORDER — IBUPROFEN 800 MG PO TABS
400.0000 mg | ORAL_TABLET | Freq: Four times a day (QID) | ORAL | Status: DC | PRN
Start: 2017-04-25 — End: 2017-04-25
  Administered 2017-04-25: 400 mg via ORAL
  Filled 2017-04-25: qty 1

## 2017-04-25 NOTE — Progress Notes (Signed)
Pt is A & O X4. Denies active SI, HI and AVH when assessed. Presents with flat affect and depressed mood. Irritable on interactions as well with poor eye contact. Per pt "I got hurt at work and I've never been the same, my mood has changed since I hurt my head at work". 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 Per report, pt got injured at work (tray fell on his head "I fell to the floor and hit the back of my head", reports poor sleep, financial stress related to not being able work X3 weeks since his injury "I'm in financial ruin. Emotional support and encouragement provided to pt. Q 15 minutes safety checks maintained without self harm gestures to note at this time.

## 2017-04-25 NOTE — Consult Note (Signed)
[facility] Consult   Reason for Consult: Consult for 52 year old man with a past history of depression who comes to the hospital with multiple symptoms of depression and somatic symptoms Referring Physician: Schaevitz Patient Identification: Vincent Colon MRN:  [id number] Principal Diagnosis: Severe recurrent major depression without psychotic features [facility]) Diagnosis:   Patient Active Problem List   Diagnosis Date Noted  . Severe recurrent major depression without psychotic features ([place]) [F33.2] 04/25/2017  . Headache [R51] 04/25/2017  . Diabetes mellitus without complication ([place]) [X83.3] 04/25/2017    Total Time spent with patient: 1 hour  Subjective:   Vincent Colon is a 52 y.o. male patient admitted with "no one understands how I am feeling".  HPI: Patient interviewed chart reviewed.  52 year old man who came to the emergency room for multiple complaints.  Patient states that on December 15 he suffered a head injury while at work.  Since then he has felt like his personality is different.  Additionally he complains that he is having almost constant headaches every day.  Also his mood has gotten worse.  He feels frustrated and down all the time.  He talks a lot about feeling hopeless.  He says he has lost interest in all of his usual enjoyable activities.  His sleep is poor and he sleeps only a few hours a night.  He feels irritable much of the time.  Patient says his concentration is worse and he has started to lose his train of thought.  His frustrations have been compounded because his job denied his complaint for [employer].  He went to see a neurologist on December 28 for follow-up of these complaints.  Patient was told that his symptoms will probably gradually get better and given some low dose Pamelor.  He feels frustrated by this as well.  Patient has started talking about having suicidal thoughts although he did not make any specific plan.  Medical  history: Looking back at his old chart he has had times of coming to the emergency room for headache problems that predate this current episode by years.  He also just in the last few weeks started complaining of tingling in his left arm and is being worked up for possible spinal stenosis.  Additionally he has diabetes which is normally managed with metformin although his blood sugar is elevated.  Also has high blood pressure.  Social history: Patient lives with his elderly mother.  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  Substance abuse history: He says that he has never used alcohol or drugs in his life  Past Psychiatric History: He had 1 previous psychiatric hospitalization which he says occurred in 1990 and was to [facility] after a suicide attempt.  It sounds like he has really not had consistent treatment since then.  He thinks of himself as possibly having bipolar disorder although he denies any clear manic symptoms or psychosis.  He says he has been prescribed medicines for depression in the past which made him feel worse but he cannot remember the name of them.  Patient's father  Risk to Self: Is patient at risk for suicide?: Yes Risk to Others:   Prior Inpatient Therapy:   Prior Outpatient Therapy:    Past Medical History:  Past Medical History:  Diagnosis Date  . Anxiety   . Depression   . Diabetes  mellitus without complication ([place])   . Hypertension     Past Surgical History:  Procedure Laterality Date  . APPENDECTOMY     Family History: History reviewed. No pertinent family history. Family Psychiatric  History: Killed himself.  Mother also suffers from depression Social History:  Social History   Substance and Sexual Activity  Alcohol Use No     Social History   Substance and Sexual Activity  Drug Use No    Social History    Socioeconomic History  . Marital status: Single    Spouse name: None  . Number of children: None  . Years of education: None  . Highest education level: None  Social Needs  . Financial resource strain: None  . Food insecurity - worry: None  . Food insecurity - inability: None  . Transportation needs - medical: None  . Transportation needs - non-medical: None  Occupational History  . None  Tobacco Use  . Smoking status: Never Smoker  . Smokeless tobacco: Never Used  Substance and Sexual Activity  . Alcohol use: No  . Drug use: No  . Sexual activity: None  Other Topics Concern  . None  Social History Narrative  . None   Additional Social History:    Allergies:  No Known Allergies  Labs:  Results for orders placed or performed during the hospital encounter of 04/25/17 (from the past 48 hour(s))  Comprehensive metabolic panel     Status: Abnormal   Collection Time: 04/25/17 12:33 PM  Result Value Ref Range   Sodium 137 135 - 145 mmol/L   Potassium 3.7 3.5 - 5.1 mmol/L   Chloride 99 (L) 101 - 111 mmol/L   CO2 28 22 - 32 mmol/L   Glucose, Bld 230 (H) 65 - 99 mg/dL   BUN 15 6 - 20 mg/dL   Creatinine, Ser 0.76 0.61 - 1.24 mg/dL   Calcium 9.3 8.9 - 10.3 mg/dL   Total Protein 7.8 6.5 - 8.1 g/dL   Albumin 4.4 3.5 - 5.0 g/dL   AST 19 15 - 41 U/L   ALT 14 (L) 17 - 63 U/L   Alkaline Phosphatase 58 38 - 126 U/L   Total Bilirubin 0.9 0.3 - 1.2 mg/dL   GFR calc non Af Amer >60 >60 mL/min   GFR calc Af Amer >60 >60 mL/min    Comment: (NOTE) The eGFR has been calculated using the CKD EPI equation. This calculation has not been validated in all clinical situations. eGFR's persistently <60 mL/min signify possible Chronic Kidney Disease.    Anion gap 10 5 - 15    Comment: Performed at [facility], [address] [place], [place] [address]  Ethanol     Status: None   Collection Time: 04/25/17 12:33 PM  Result Value Ref Range   Alcohol, Ethyl (B) <10 <10 mg/dL     Comment:        LOWEST DETECTABLE LIMIT FOR SERUM ALCOHOL IS 10 mg/dL FOR MEDICAL PURPOSES ONLY Performed at [facility], [address] [place], [place] [address]   Salicylate level     Status: None   Collection Time: 04/25/17 12:33 PM  Result Value Ref Range   Salicylate Lvl <1.9 2.8 - 30.0 mg/dL    Comment: Performed at [facility], [address] [place], [place] [address]  Acetaminophen level     Status: Abnormal   Collection Time: 04/25/17 12:33 PM  Result Value Ref Range   Acetaminophen (Tylenol), Serum <10 (  L) 10 - 30 ug/mL    Comment:        THERAPEUTIC CONCENTRATIONS VARY SIGNIFICANTLY. A RANGE OF 10-30 ug/mL MAY BE AN EFFECTIVE CONCENTRATION FOR MANY PATIENTS. HOWEVER, SOME ARE BEST TREATED AT CONCENTRATIONS OUTSIDE THIS RANGE. ACETAMINOPHEN CONCENTRATIONS >150 ug/mL AT 4 HOURS AFTER INGESTION AND >50 ug/mL AT 12 HOURS AFTER INGESTION ARE OFTEN ASSOCIATED WITH TOXIC REACTIONS. Performed at [facility], [address] [place], [place] [address]   cbc     Status: None   Collection Time: 04/25/17 12:33 PM  Result Value Ref Range   WBC 5.6 3.8 - 10.6 K/uL   RBC 4.92 4.40 - 5.90 MIL/uL   Hemoglobin 14.9 13.0 - 18.0 g/dL   HCT 44.6 40.0 - 52.0 %   MCV 90.7 80.0 - 100.0 fL   MCH 30.3 26.0 - 34.0 pg   MCHC 33.4 32.0 - 36.0 g/dL   RDW 12.9 11.5 - 14.5 %   Platelets 257 150 - 440 K/uL    Comment: Performed at [facility], [address] [place], [place] [address]  Urine Drug Screen, Qualitative     Status: Abnormal   Collection Time: 04/25/17 12:33 PM  Result Value Ref Range   Tricyclic, Ur Screen POSITIVE (A) NONE DETECTED   Amphetamines, Ur Screen NONE DETECTED NONE DETECTED   MDMA (Ecstasy)Ur Screen NONE DETECTED NONE DETECTED   Cocaine Metabolite,Ur [place] NONE DETECTED NONE DETECTED   Opiate, Ur Screen NONE DETECTED NONE DETECTED   Phencyclidine (PCP) Ur S NONE DETECTED NONE DETECTED   Cannabinoid 50 Ng, Ur [place]  NONE DETECTED NONE DETECTED   Barbiturates, Ur Screen POSITIVE (A) NONE DETECTED   Benzodiazepine, Ur Scrn NONE DETECTED NONE DETECTED   Methadone Scn, Ur NONE DETECTED NONE DETECTED    Comment: (NOTE) Tricyclics + metabolites, urine    Cutoff 1000 ng/mL Amphetamines + metabolites, urine  Cutoff 1000 ng/mL MDMA (Ecstasy), urine              Cutoff 500 ng/mL Cocaine Metabolite, urine          Cutoff 300 ng/mL Opiate + metabolites, urine        Cutoff 300 ng/mL Phencyclidine (PCP), urine         Cutoff 25 ng/mL Cannabinoid, urine                 Cutoff 50 ng/mL Barbiturates + metabolites, urine  Cutoff 200 ng/mL Benzodiazepine, urine              Cutoff 200 ng/mL Methadone, urine                   Cutoff 300 ng/mL The urine drug screen provides only a preliminary, unconfirmed analytical test result and should not be used for non-medical purposes. Clinical consideration and professional judgment should be applied to any positive drug screen result due to possible interfering substances. A more specific alternate chemical method must be used in order to obtain a confirmed analytical result. Gas chromatography / mass spectrometry (GC/MS) is the preferred confirmat ory method. Performed at [facility], [address] [place], [place] [address]     Current Facility-Administered Medications  Medication Dose Route Frequency Provider Last Rate Last Dose  . eletriptan (RELPAX) tablet 20 mg  20 mg Oral Q2H PRN Clapacs, John T, MD      . hydrochlorothiazide (HYDRODIURIL) tablet 25 mg  25 mg Oral Daily Clapacs, Madie [place], MD      . Derrill Memo  ON 04/26/2017] insulin aspart (novoLOG) injection 0-15 Units  0-15 Units Subcutaneous TID WC Clapacs, John T, MD      . metFORMIN (GLUCOPHAGE) tablet 1,000 mg  1,000 mg Oral BID WC Clapacs, John T, MD      . mirtazapine (REMERON) tablet 15 mg  15 mg Oral QHS Clapacs, Madie Reno, MD       Current Outpatient Medications  Medication Sig Dispense Refill  .  butalbital-acetaminophen-caffeine (FIORICET, ESGIC) 50-325-40 MG tablet Take 1-2 tablets by mouth every 6 (six) hours as needed for headache. 20 tablet 0  . diphenhydrAMINE (BENADRYL) 25 mg capsule Take 2 capsules (50 mg total) by mouth every 8 (eight) hours as needed (with metoclopramid for headache). (Patient not taking: Reported on 03/20/2017) 60 capsule 0  . fexofenadine-pseudoephedrine (ALLEGRA-D) 60-120 MG 12 hr tablet Take 1 tablet by mouth 2 (two) times daily. 20 tablet 0  . hydrochlorothiazide (HYDRODIURIL) 25 MG tablet Take 1 tablet (25 mg total) by mouth daily. (Patient taking differently: Take 12.5 mg by mouth daily. ) 30 tablet 0  . metFORMIN (GLUCOPHAGE-XR) 500 MG 24 hr tablet Take 1,000 mg by mouth 2 (two) times daily.    . metoCLOPramide (REGLAN) 10 MG tablet Take 1 tablet (10 mg total) by mouth every 6 (six) hours as needed for nausea. 20 tablet 0  . predniSONE (DELTASONE) 50 MG tablet Take 1 tablet (50 mg total) by mouth daily with breakfast. 5 tablet 0    Musculoskeletal: Strength & Muscle Tone: within normal limits Gait & Station: normal Patient leans: N/A  Psychiatric Specialty Exam: Physical Exam  Nursing note and vitals reviewed. Constitutional: He appears well-developed and well-nourished.  HENT:  Head: Normocephalic and atraumatic.  Eyes: Conjunctivae are normal. Pupils are equal, round, and reactive to light.  Neck: Normal range of motion.  Cardiovascular: Regular rhythm and normal heart sounds.  Respiratory: Effort normal. No respiratory distress.  GI: Soft.  Musculoskeletal: Normal range of motion.  Neurological: He is alert.  Skin: Skin is warm and dry.  Psychiatric: His speech is normal. His affect is angry. He is agitated. He is not aggressive. Cognition and memory are normal. He expresses impulsivity. He exhibits a depressed mood. He expresses suicidal ideation. He expresses no suicidal plans.    Review of Systems  Constitutional: Negative.   HENT:  Negative.   Eyes: Negative.   Respiratory: Negative.   Cardiovascular: Negative.   Gastrointestinal: Negative.   Musculoskeletal: Negative.   Skin: Negative.   Neurological: Positive for tingling, tremors and headaches.  Psychiatric/Behavioral: Positive for depression, memory loss and suicidal ideas. Negative for hallucinations and substance abuse. The patient is nervous/anxious and has insomnia.     Blood pressure (!) 134/94, pulse 88, temperature 98.3 F (36.8 C), temperature source Oral, resp. rate 18, height 5' 11" (1.803 m), weight 98.4 kg (217 lb), SpO2 98 %.Body mass index is 30.27 kg/m.  General Appearance: Casual  Eye Contact:  Minimal  Speech:  Slow  Volume:  Decreased  Mood:  Angry, Anxious and Depressed  Affect:  Constricted  Thought Process:  Goal Directed  Orientation:  Full (Time, Place, and Person)  Thought Content:  Rumination  Suicidal Thoughts:  Yes.  without intent/plan  Homicidal Thoughts:  No  Memory:  Immediate;   Fair Recent;   Fair Remote;   Fair  Judgement:  Fair  Insight:  Fair  Psychomotor Activity:  Normal  Concentration:  Concentration: Fair  Recall:  [employer] of Knowledge:  Fair  Language:  Fair  Akathisia:  No  Handed:  Right  AIMS (if indicated):     Assets:  Communication Skills Desire for Improvement Housing Resilience Social Support  ADL's:  Intact  Cognition:  WNL  Sleep:        Treatment Plan Summary: Medication management and Plan 52 year old man who is complaining of multiple symptoms all consistent with major depression including depressed mood, lack of enjoyment of usual activities.  Poor sleep or appetite suicidal ideation poor concentration.  He is very fixated on the idea that this all flows from the head injury he suffered December 12.  He is very fixated on the idea that he is cognitively impaired although he certainly does not seem to be obviously cognitively impaired in our conversation but rather impaired in his  mood.  Patient has several risk factors for suicide.  I think he needs to be admitted to the hospital for stabilization.  Continue medicines for diabetes and high blood pressure and have monitoring and insulin as a backup.  Added mirtazapine for depression.  Apparently the only medicine he has been given for these headaches is Fioricet.  I am going to suggest that we try Relpax if these are truly migraine type headaches.  Motrin can also be available as a as needed.  15-minute checks.  Full set of labs.  Patient understands the plan.  Case reviewed with TTS  Disposition: Recommend psychiatric Inpatient admission when medically cleared. Supportive therapy provided about ongoing stressors.  Alethia Berthold, MD 04/25/2017 5:34 PM

## 2017-04-25 NOTE — ED Notes (Signed)
Pt. States HA improved with medication given.  Pt. Depressed but was able to talk to sister around 7 pm which appeared to make patient feel better.  Pt. Currently talking to TTS at this time.

## 2017-04-25 NOTE — ED Notes (Signed)
Pt changed into hospital scrubs with this nurse and officer in the room in triage.  Belongings placed into belongings bag.

## 2017-04-25 NOTE — BH Assessment (Signed)
[facility] been [facility].  Accepting physician is Dr. Toni Amendlapacs.  Attending Physician will be Dr. Jennet MaduroPucilowska.  Patient has been [facility] to room 323, [facility] [place].  Call report to [phone].  Representative/Transfer Coordinator is Durward Mallardamille H Patient pre-admitted by [facility] Patient Access Mertie Clause(Jeanelle)
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
## 2017-04-25 NOTE — Tx Team (Signed)
Initial Treatment Plan 04/25/2017 11:20 PM Gerline LegacyBarry L Brecht [id number]    PATIENT STRESSORS: Financial difficulties Health problems Legal issue Marital or family conflict Occupational concerns Traumatic event   PATIENT STRENGTHS: Ability for insight Average or above average intelligence Capable of independent living Communication skills General fund of knowledge Motivation for treatment/growth Supportive family/friends Work skills   PATIENT IDENTIFIED PROBLEMS: Depression 04/25/17  Anxiety 04/25/17  Stressors 04/25/17                 DISCHARGE CRITERIA:  Ability to meet basic life and health needs Improved stabilization in mood, thinking, and/or behavior Medical problems require only outpatient monitoring Motivation to continue treatment in a less acute level of care Safe-care adequate arrangements made Verbal commitment to aftercare and medication compliance  PRELIMINARY DISCHARGE PLAN: Outpatient therapy Return to previous living arrangement Return to previous work or school arrangements  PATIENT/FAMILY INVOLVEMENT: This treatment plan has been presented to and reviewed with the patient, Gerline LegacyBarry L Coulon.  The patient and family have been given the opportunity to ask questions and make suggestions.  Lenox Pondserry J Annye Forrey, RN 04/25/2017, 11:20 PM

## 2017-04-25 NOTE — ED Triage Notes (Addendum)
FIRST NURSE NOTE-pt here for re-eval of head injury. Has been seen here X 2. Saw neurology last week.  Upset because neurology would not take him out of work. "my psychotherapist said I should be out of work for depression and anxiety but my work is not doing anything".  Ambulatory without distress.

## 2017-04-25 NOTE — ED Provider Notes (Signed)
[facility] Emergency Department Provider Note  ____________________________________________   First MD Initiated Contact with Patient 04/25/17 1249     (approximate)  I have reviewed the triage vital signs and the nursing notes.   HISTORY  Chief Complaint Headache and Suicidal   HPI Vincent Colon is a 52 y.o. male with a history of anxiety and depression who is presenting to the emergency department with a right-sided headache over the past month sustained from a work injury.  He says that he also has a history of depression and has been seeing a therapist for years.  He says that since having a headache he has been unable to work and has been becoming more more depressed and less interested in the things that he previously love such as writing and the humanities.  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  Is having thoughts of "not being here anymore."  History of his father committing suicide.  No specific plan to kill himself.  Past Medical History:  Diagnosis Date  . Anxiety   . Depression   . Diabetes mellitus without complication (HCC)   . Hypertension     There are no active problems to display for this patient.   Past Surgical History:  Procedure Laterality Date  . APPENDECTOMY      Prior to Admission medications   Medication Sig Start Date End Date Taking? Authorizing Provider  butalbital-acetaminophen-caffeine (FIORICET, ESGIC) [id number] MG tablet Take 1-2 tablets by mouth every 6 (six) hours as needed for headache. 04/05/17 04/05/18  Joni Reining, PA-C  diphenhydrAMINE (BENADRYL) 25 mg capsule Take 2 capsules (50 mg total) by mouth every 8 (eight) hours as needed (with metoclopramid for headache). Patient not taking: Reported on 03/20/2017 07/24/15   Sharman Cheek, MD  fexofenadine-pseudoephedrine (ALLEGRA-D) 60-120 MG 12 hr tablet Take 1 tablet by mouth 2 (two) times daily. 04/05/17   Joni Reining,  PA-C  hydrochlorothiazide (HYDRODIURIL) 25 MG tablet Take 1 tablet (25 mg total) by mouth daily. Patient taking differently: Take 12.5 mg by mouth daily.  07/24/15   Sharman Cheek, MD  metFORMIN (GLUCOPHAGE-XR) 500 MG 24 hr tablet Take 1,000 mg by mouth 2 (two) times daily.    [provider]  metoCLOPramide (REGLAN) 10 MG tablet Take 1 tablet (10 mg total) by mouth every 6 (six) hours as needed for nausea. 04/07/17   Minna Antis, MD  predniSONE (DELTASONE) 50 MG tablet Take 1 tablet (50 mg total) by mouth daily with breakfast. 03/20/17   Jene Every, MD    Allergies Patient has no known allergies.  History reviewed. No pertinent family history.  Social History Social History   Tobacco Use  . Smoking status: Never Smoker  . Smokeless tobacco: Never Used  Substance Use Topics  . Alcohol use: No  . Drug use: No    Review of Systems  Constitutional: No fever/chills Eyes: No visual changes. ENT: No sore throat. Cardiovascular: Denies chest pain. Respiratory: Denies shortness of breath. Gastrointestinal: No abdominal pain.  No nausea, no vomiting.  No diarrhea.  No constipation. Genitourinary: Negative for dysuria. Musculoskeletal: Negative for back pain. Skin: Negative for rash. Neurological: Negative for focal weakness or numbness.   ____________________________________________   PHYSICAL EXAM:  VITAL SIGNS: ED Triage Vitals  Enc Vitals Group     BP 04/25/17 1234 (!) 131/109     Pulse Rate 04/25/17 1234 91     Resp 04/25/17 1234 18  Temp 04/25/17 1234 98.5 F (36.9 C)     Temp Source 04/25/17 1234 Oral     SpO2 04/25/17 1234 99 %     Weight 04/25/17 1235 217 lb (98.4 kg)     Height 04/25/17 1235 5\' 11"  (1.803 m)     Head Circumference --      Peak Flow --      Pain Score 04/25/17 1234 9     Pain Loc --      Pain Edu? --      Excl. in GC? --     Constitutional: Alert and oriented. in no acute distress. Eyes: Conjunctivae are normal.    Head: Atraumatic. Nose: No congestion/rhinnorhea. Mouth/Throat: Mucous membranes are moist.  Neck: No stridor.   Cardiovascular: Normal rate, regular rhythm. Grossly normal heart sounds.   Respiratory: Normal respiratory effort.  No retractions. Lungs CTAB. Gastrointestinal: Soft and nontender. No distention. No CVA tenderness. Musculoskeletal: No lower extremity tenderness nor edema.  No joint effusions. Neurologic:  Normal speech and language. No gross focal neurologic deficits are appreciated. Skin:  Skin is warm, dry and intact. No rash noted. Psychiatric: Patient agitated during interview.  Becomes tearful when talking about the suicide of his father.  ____________________________________________   LABS (all labs ordered are listed, but only abnormal results are displayed)  Labs Reviewed  COMPREHENSIVE METABOLIC PANEL - Abnormal; Notable for the following components:      Result Value   Chloride 99 (*)    Glucose, Bld 230 (*)    ALT 14 (*)    All other components within normal limits  ACETAMINOPHEN LEVEL - Abnormal; Notable for the following components:   Acetaminophen (Tylenol), Serum <10 (*)    All other components within normal limits  ETHANOL  SALICYLATE LEVEL  CBC  URINE DRUG SCREEN, QUALITATIVE ([facility] ONLY)   ____________________________________________  EKG   ____________________________________________  RADIOLOGY   ____________________________________________   PROCEDURES  Procedure(s) performed:   Procedures  Critical Care performed:   ____________________________________________   INITIAL IMPRESSION / ASSESSMENT AND PLAN / ED COURSE  Pertinent labs & imaging results that were available during my care of the patient were reviewed by me and considered in my medical decision making (see chart for details).  DDX: Suicidal ideation, depression, postconcussive syndrome, headache As part of my medical decision making, I reviewed the following  data within the electronic MEDICAL RECORD NUMBER Notes from prior ED visits   Patient appears depressed and agitated.  Psychiatry to see.  Discussed with Dr. Toni Amendlapacs we will hold on involuntary commitment at this time.      ____________________________________________   FINAL CLINICAL IMPRESSION(S) / ED DIAGNOSES  Depression.  Postconcussive syndrome.    NEW MEDICATIONS STARTED DURING THIS VISIT:  This SmartLink is deprecated. Use AVSMEDLIST instead to display the medication list for a patient.   Note:  This document was prepared using Dragon voice recognition software and may include unintentional dictation errors.     Myrna BlazerSchaevitz, Danielle Lento Matthew, MD 04/25/17 1356
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
## 2017-04-25 NOTE — ED Notes (Signed)
Pt refusing to speak in the hallway. No open beds at this time.

## 2017-04-26 DIAGNOSIS — F332 Major depressive disorder, recurrent severe without psychotic features: Principal | ICD-10-CM

## 2017-04-26 DIAGNOSIS — F5105 Insomnia due to other mental disorder: Secondary | ICD-10-CM

## 2017-04-26 DIAGNOSIS — F409 Phobic anxiety disorder, unspecified: Secondary | ICD-10-CM

## 2017-04-26 LAB — LIPID PANEL
CHOL/HDL RATIO: 5.5 ratio
Cholesterol: 165 mg/dL (ref 0–200)
HDL: 30 mg/dL — AB (ref >40)
LDL Cholesterol: 121 mg/dL — ABNORMAL HIGH (ref 0–99)
Triglycerides: 72 mg/dL (ref <150)
VLDL: 14 mg/dL (ref 0–40)

## 2017-04-26 LAB — TSH: TSH: 2.946 u[IU]/mL (ref 0.350–4.500)

## 2017-04-26 LAB — HEMOGLOBIN A1C
Hgb A1c MFr Bld: 8.3 % — ABNORMAL HIGH (ref 4.8–5.6)
MEAN PLASMA GLUCOSE: 191.51 mg/dL

## 2017-04-26 NOTE — Plan of Care (Signed)
Pt. Verbally contracts for safety. Pt able to remain safe while on the unit. Pt denies SI/HI.

## 2017-04-26 NOTE — Progress Notes (Signed)
D: Received patient from [facility] emergency room. Patient skin assessment completed with Lorenda HatchetSlade RN, skin is intact, but pt does have bilateral ankle skin discoloration and back and chest color discoloration, no contraband found. Pt. Was admitted under the services of, Dr. Jennet MaduroPucilowska.    A: Patient oriented to unit/room/call light.Patient was encourage to participate in unit activities and continue with plan of care. Q x 15 minute observation checks were completed for safety.   R: Patient is receptive to treatment and safety maintained on unit. Pt verbally contracts for safety and denies SI/HI.
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
## 2017-04-26 NOTE — Plan of Care (Signed)
Patient reports that he slept well last night and demonstrates that he has the ability to function at an adequate level. Patient has shown some interest in activities on the unit. Patient denies SI/HI/AVH as well as depression/anxiety at this time. Patient verbalizes understanding of the general information that has been provided to him without voicing any further questions or concerns. Patient has the ability to make informed decisions regarding his treatment plan. Patient has demonstrated having the ability to cope because he has been observed in his room reading/writing. Patient has been in compliance with his therapeutic regimen thus far. Patient has remained free from injury thus far and is safe on the unit at this time.

## 2017-04-26 NOTE — Progress Notes (Signed)
Patient stated he had suffered a recent head injury and has been in hospital three weeks. Patient requested shower kit and that his sister be notified that he was in the BMU. Vincent Colon listened to patient, notified nurse of his needs, and placed a call to his sister on his behalf. Patient was a bit disoriented. [facility] will follow-up at request of patient.

## 2017-04-26 NOTE — [facility] Group Notes (Signed)

## 2017-04-26 NOTE — Progress Notes (Signed)
  D:Pt denies SI/HI/AVH. Pt is pleasant and cooperative, but appears depressed. Pt. reports depression, sadness, and hopelessness.  Patient interaction during admissions is engaging and appropriate. Pt verbally contracts for safety.   A: Q x 15 minute observation checks were completed for safety. Patient was provided with education. Patient  was encourage to attend groups, participate in unit activities and continue with plan of care.   R:Patient is complaint with admissions process.              Patient slept for Estimated Hours of 4.30; Precautionary checks every 15 minutes for safety maintained, room free of safety hazards, patient sustains no injury or falls during this shift.

## 2017-04-26 NOTE — Progress Notes (Signed)
D- Patient alert and oriented. Patient presents in a pleasant, but sad mood on assessment requesting to get his everyday essential supplies from this Clinical research associatewriter. Patient was concerned about taking insulin this morning stating "I've never been on insulin, and from what I've seen with my family members, you die once you start taking insulin". This Clinical research associatewriter reinforced to the patient how insulin is used and he later decided to take his afternoon dose. Patient denies SI, HI, AVH, and pain at this time.   A- Scheduled medications administered to patient, per MD orders. Support and encouragement provided.  Routine safety checks conducted every 15 minutes.  Patient informed to notify staff with problems or concerns.  R- No adverse drug reactions noted. Patient contracts for safety at this time. Patient compliant with medications and treatment plan. Patient receptive, calm, and cooperative. Patient interacts well with others on the unit.  Patient remains safe at this time.
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
## 2017-04-27 ENCOUNTER — Encounter: Payer: Self-pay | Admitting: Psychiatry

## 2017-04-27 DIAGNOSIS — I10 Essential (primary) hypertension: Secondary | ICD-10-CM | POA: Diagnosis present

## 2017-04-27 NOTE — Progress Notes (Signed)
Patient ID: Vincent SjogrenBarry Lynn Colon, male   DOB: Dec 09, 1965, 52 y.o.   MRN: [id number]   Pt blood sugar was 135 on 04/27/17 @430pm . Vincent BalintShalita Selyna Klahn RN

## 2017-04-27 NOTE — [facility] Group Notes (Signed)
[facility] Group Notes:  (Nursing/MHT/Case Management/Adjunct)  Date:  04/27/2017  Time:  11:15 PM  Type of Therapy:  Group Therapy  Participation Level:  Active  Participation Quality:  Appropriate  Affect:  Appropriate  Cognitive:  Appropriate  Insight:  Appropriate  Engagement in Group:  Engaged  Modes of Intervention:  Discussion  Summary of Progress/Problems:  Vincent EkJanice Marie Orris Colon 04/27/2017, 11:15 PM

## 2017-04-27 NOTE — Progress Notes (Signed)
Patient ID: Vincent Colon, male   DOB: 09-29-1965, 52 y.o.   MRN: [id number]    D: Pt has been very flat and depressed on the unit today. Pt reported that he just could not get it together. Pt did attend all groups and engaged in treatment, but reported that he remains very hopeless and helpless.  Pt reported being negative SI/HI, no AH/VH noted. Pt took all medication without any problems. A: 15 min checks continued for patient safety. R: Pt safety maintained.

## 2017-04-27 NOTE — Progress Notes (Signed)
Patient is sleeping long hours and safety is maintained, 15 minute checks is in effect, patient is doing well in the unit, participating in groups and cooperating with taking his medicines. Denies any thoughts of suicide ideas and no signs of AVH at this time. No complain from patient.

## 2017-04-27 NOTE — Progress Notes (Signed)
Patient ID: Vincent Colon, male   DOB: 1965/08/29, 52 y.o.   MRN: [id number]    Pt blood sugar was 259 on 04/27/17 @1130am . Jacquelyne BalintShalita Lafern Brinkley RN
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
## 2017-04-27 NOTE — BHH Group Notes (Signed)
LCSW Group Therapy Note 04/27/2017 1:15pm  Type of Therapy and Topic: Group Therapy: Feelings Around Returning Home & Establishing a Supportive Framework and Supporting Oneself When Supports Not Available  Participation Level: Active  Description of Group:  Patients first processed thoughts and feelings about upcoming discharge. These included fears of upcoming changes, lack of change, new living environments, judgements and expectations from others and overall stigma of mental health issues. The group then discussed the definition of a supportive framework, what that looks and feels like, and how do to discern it from an unhealthy non-supportive network. The group identified different types of supports as well as what to do when your family/friends are less than helpful or unavailable  Therapeutic Goals  1. Patient will identify one healthy supportive network that they can use at discharge. 2. Patient will identify one factor of a supportive framework and how to tell it from an unhealthy network. 3. Patient able to identify one coping skill to use when they do not have positive supports from others. 4. Patient will demonstrate ability to communicate their needs through discussion and/or role plays.  Summary of Patient Progress:  Pt scored his mood 8 or 9 (10 best). He reported during his admission he's had time to reflect and process some of the things that's been happening to him lately. He indicates that he is not where he was a couple of days ago and feels more hopeful. Pt engaged during group session. As patients processed their anxiety about discharge and described healthy supports patient shared that he has regain his faith and feels more confident about his life.  Patients identified at least one self-care tool they were willing to use after discharge.   Therapeutic Modalities Cognitive Behavioral Therapy Motivational Interviewing   Vincent Colon  [name], LCSW 04/27/2017 10:01 AM

## 2017-04-28 LAB — GLUCOSE, CAPILLARY
GLUCOSE-CAPILLARY: 156 mg/dL — AB (ref 65–99)
GLUCOSE-CAPILLARY: 97 mg/dL (ref 65–99)
GLUCOSE-CAPILLARY: 121 mg/dL — AB (ref 65–99)
GLUCOSE-CAPILLARY: 139 mg/dL — AB (ref 65–99)
Glucose-Capillary: 126 mg/dL — ABNORMAL HIGH (ref 65–99)
Glucose-Capillary: 259 mg/dL — ABNORMAL HIGH (ref 65–99)
Glucose-Capillary: 135 mg/dL — ABNORMAL HIGH (ref 65–99)
Glucose-Capillary: 130 mg/dL — ABNORMAL HIGH (ref 65–99)
Glucose-Capillary: 229 mg/dL — ABNORMAL HIGH (ref 65–99)

## 2017-04-28 MED ORDER — MIRTAZAPINE 15 MG PO TABS: 15.0000 mg | ORAL_TABLET | Freq: Every day | ORAL | 1 refills | Status: DC

## 2017-04-28 MED ORDER — TRAZODONE HCL 100 MG PO TABS: 100.0000 mg | ORAL_TABLET | Freq: Every evening | ORAL | 1 refills | Status: DC | PRN

## 2017-04-28 MED ORDER — ELETRIPTAN HYDROBROMIDE 20 MG PO TABS: 20.0000 mg | ORAL_TABLET | ORAL | 1 refills | Status: DC | PRN

## 2017-04-28 NOTE — Progress Notes (Addendum)
Patient found in dayroom upon my arrival. Patient is visible and appropriately social throughout the evening. Patient reports much improvement in head pain, depression, anxiety, and sleep. States, "I can't say I'm not depressed or anxious but it is now at a level I can deal with because I have slept. I didn't sleep for a long time before I came in here. They really helped me with that and my head pain." Denies pain. Reports eating and voiding adequately. Patient reports he has not been able to draw for "a long time" but drew a very nice self-portrait today. Reports having elevated BP earlier in his admission. BP WNL at this time. Compliant with HS medications. Given Trazodone for sleep along with Remeron with positive results. Q 15 minute checks maintained. Will continue to monitor throughout the shift.  Patient reports sleeping through the night. Denies headache. Compliant with VS. VSS. Will endorse care to oncoming shift.

## 2017-04-28 NOTE — BHH Group Notes (Signed)
04/28/2017 9:30AM  Type of Therapy and Topic:  Group Therapy:  Overcoming Obstacles  Participation Level:  Did Not Attend    Description of Group:    In this group patients will be encouraged to explore what they see as obstacles to their own wellness and recovery. They will be guided to discuss their thoughts, feelings, and behaviors related to these obstacles. The group will process together ways to cope with barriers, with attention given to specific choices patients can make. Each patient will be challenged to identify changes they are motivated to make in order to overcome their obstacles. This group will be process-oriented, with patients participating in exploration of their own experiences as well as giving and receiving support and challenge from other group members.   Therapeutic Goals: 1. Patient will identify personal and current obstacles as they relate to admission. 2. Patient will identify barriers that currently interfere with their wellness or overcoming obstacles.  3. Patient will identify feelings, thought process and behaviors related to these barriers. 4. Patient will identify two changes they are willing to make to overcome these obstacles:      Summary of Patient Progress Patient was encouraged and invited to attend group. Patient did not attend group. Social worker will continue to encourage group participation in the future.     Therapeutic Modalities:   Cognitive Behavioral Therapy Solution Focused Therapy Motivational Interviewing Relapse Prevention Therapy    Verner Kopischke MSW, LCSWA 04/28/2017 10:33 AM 

## 2017-04-28 NOTE — Progress Notes (Signed)
Recreation Therapy Notes  Date: 01.07.2019  Time: 1:00pm  Location: Craft Room  Behavioral response: Appropriate  Intervention Topic: Problem Solving  Discussion/Intervention: Group content on today was focused on problem solving. The group described what problem solving is. Patients expressed how problems affect them and how they deal with problems. Individuals identified healthy ways to deal with problems. Patients explained what normally happens to them when they do not deal with problems. The group expressed reoccurring problems for them. The group participated in the intervention "Put the story together" with their peers and worked together to put a story that was broken up in the correct order. Clinical Observations/Feedback:  Patient came to group and defined problem solving as seeking a solution or coming up with a new idea. He stated he brainstorms to find new ideas to solve his problems. Individual participated in intervention and was social with peers and staff during group.  Ceriah Kohler LRT/CTRS         Bravlio Luca 04/28/2017 3:09 PM

## 2017-04-28 NOTE — [facility] Suicide Risk Assessment (Signed)
[facility]   Principal Problem: Severe recurrent major depression without psychotic features [facility](HCC) Discharge Diagnoses:  Patient Active Problem List   Diagnosis Date Noted  . Severe recurrent major depression without psychotic features (HCC) [F33.2] 04/25/2017    Priority: High  . HTN (hypertension) [I10] 04/27/2017  . Insomnia due to anxiety and fear [F51.05, F40.9]   . Headache [R51] 04/25/2017  . Diabetes mellitus without complication (HCC) [E11.9] 04/25/2017    Total Time spent with patient: 30 minutes  Musculoskeletal: Strength & Muscle Tone: within normal limits Gait & Station: normal Patient leans: N/A  Psychiatric Specialty Exam: Review of Systems  Neurological: Positive for headaches.  Psychiatric/Behavioral: The patient has insomnia.   All other systems reviewed and are negative.   Blood pressure 103/73, pulse (!) 104, temperature 98.8 F (37.1 C), temperature source Oral, resp. rate 18, height 5\' 11"  (1.803 m), weight 95.7 kg (211 lb), SpO2 100 %.Body mass index is 29.43 kg/m.  General Appearance: Casual  Eye Contact::  Good  Speech:  Clear and Coherent409  Volume:  Normal  Mood:  Anxious  Affect:  Appropriate  Thought Process:  Goal Directed and Descriptions of Associations: Intact  Orientation:  Full (Time, Place, and Person)  Thought Content:  WDL  Suicidal Thoughts:  No  Homicidal Thoughts:  No  Memory:  Immediate;   Fair Recent;   Fair Remote;   Fair  Judgement:  Fair  Insight:  Fair  Psychomotor Activity:  Normal  Concentration:  Fair  Recall:  [employer]  Fund of Knowledge:Fair  Language: Fair  Akathisia:  No  Handed:  Right  AIMS (if indicated):     Assets:  Communication Skills Desire for Improvement Financial Resources/Insurance Housing Resilience Social Support Talents/Skills  Sleep:  Number of Hours: 6  Cognition: WNL  ADL's:  Intact   Mental Status Per Nursing Assessment::   On Admission:   NA  Demographic Factors:  Male  Loss Factors: Decrease in vocational status, Decline in physical health and Financial problems/change in socioeconomic status  Historical Factors: Impulsivity  Risk Reduction Factors:   Sense of responsibility to family, Employed, Living with another person, especially a relative, Positive social support and Positive therapeutic relationship  Continued Clinical Symptoms:  Depression:   Impulsivity Insomnia Recent sense of peace/wellbeing  Cognitive Features That Contribute To Risk:  None    Suicide Risk:  Minimal: No identifiable suicidal ideation.  Patients presenting with no risk factors but with morbid ruminations; may be classified as minimal risk based on the severity of the depressive symptoms  Follow-up Information    Molly MaduroRobert Milan,LCSW Follow up in 5 day(s).   Why:  Pt reports he has a follow up appointment with his therapist scheduled on 05/01/17 at 9am.  Contact information: [address], [place], [place] [address]  Main Phone: [phone] Fax: [phone] Email: [email or web address]           Plan Of Care/Follow-up recommendations:  Activity:  as tolerated Diet:  low sodium heart healthy ADA diet Other:  keep follow up appointments  Kristine LineaJolanta Pucilowska, MD 04/28/2017, 11:13 AM

## 2017-04-28 NOTE — Progress Notes (Signed)
Denies SI/HI/AVH.  Pleasant and cooperative.  Verbalizes that he wants to continue with outpatient therapy once discharged.  Discharge instructions given, verbalized understanding. Prescriptions given.  Stored medications and personal belongings returned.  Escorted off unit by this writer to the ED parking lot to obtain his car so that he may travel home.

## 2017-04-28 NOTE — Progress Notes (Signed)
[facility] present during treatment team meeting. Patient repeated the same narrative as when [facility] met with him last. Patient stated he has a support system that will help him upon his discharge. Patient stated he is feeling better and in not having suicidal thoughts and has not had them since speaking with his therapist at the end of last week. CH is available to follow-up at patients request.

## 2017-04-28 NOTE — Plan of Care (Signed)
  Progressing Spiritual Needs Ability to function at adequate level 04/28/2017 0003 - Progressing by Galen ManilaVigil, Moriya Mitchell E, RN Activity: Interest or engagement in activities will improve 04/28/2017 0003 - Progressing by Galen ManilaVigil, Taneasha Fuqua E, RN Sleeping patterns will improve 04/28/2017 0003 - Progressing by Galen ManilaVigil, Dayshaun Whobrey E, RN Education: Knowledge of [facility] Education information/materials will improve 04/28/2017 0003 - Progressing by Galen ManilaVigil, Londin Antone E, RN Emotional status will improve 04/28/2017 0003 - Progressing by Galen ManilaVigil, Mayla Biddy E, RN Mental status will improve 04/28/2017 0003 - Progressing by Galen ManilaVigil, Emma Schupp E, RN Verbalization of understanding the information provided will improve 04/28/2017 0003 - Progressing by Galen ManilaVigil, Tanaia Hawkey E, RN Safety: Periods of time without injury will increase 04/28/2017 0003 - Progressing by Galen ManilaVigil, Marsa Matteo E, RN Education: Ability to make informed decisions regarding treatment will improve 04/28/2017 0003 - Progressing by Galen ManilaVigil, Bracken Moffa E, RN Coping: Ability to cope will improve 04/28/2017 0003 - Progressing by Galen ManilaVigil, Azhia Siefken E, RN Medication: Compliance with prescribed medication regimen will improve 04/28/2017 0003 - Progressing by Galen ManilaVigil, Gwenlyn Hottinger E, RN Self-Concept: Ability to disclose and discuss suicidal ideas will improve 04/28/2017 0003 - Progressing by Galen ManilaVigil, Caison Hearn E, RN Ability to verbalize positive feelings about self will improve 04/28/2017 0003 - Progressing by Galen ManilaVigil, Lathan Gieselman E, RN Safety: Ability to remain free from injury will improve 04/28/2017 0003 - Progressing by Galen ManilaVigil, Jacquel Mccamish E, RN
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
## 2017-04-28 NOTE — Tx Team (Signed)
Interdisciplinary Treatment and Diagnostic Plan Update  04/28/2017 Time of Session: 10:45am [name] MRN: [id number]  Principal Diagnosis: Severe recurrent major depression without psychotic features [facility])  Secondary Diagnoses: Principal Problem:   Severe recurrent major depression without psychotic features (HCC) Active Problems:   Headache   Diabetes mellitus without complication (HCC)   Insomnia due to anxiety and fear   HTN (hypertension)   Current Medications:  Current Facility-Administered Medications  Medication Dose Route Frequency Provider Last Rate Last Dose  . acetaminophen (TYLENOL) tablet 650 mg  650 mg Oral Q6H PRN Clapacs, John T, MD      . alum & mag hydroxide-simeth (MAALOX/MYLANTA) 200-200-20 MG/5ML suspension 30 mL  30 mL Oral Q4H PRN Clapacs, John T, MD      . eletriptan (RELPAX) tablet 20 mg  20 mg Oral Q2H PRN Clapacs, John T, MD      . hydrochlorothiazide (HYDRODIURIL) tablet 25 mg  25 mg Oral Daily Clapacs, John T, MD   25 mg at 04/28/17 1324  . hydrOXYzine (ATARAX/VISTARIL) tablet 25 mg  25 mg Oral TID PRN Clapacs, Jackquline Denmark, MD      . ibuprofen (ADVIL,MOTRIN) tablet 400 mg  400 mg Oral Q6H PRN Clapacs, John T, MD      . insulin aspart (novoLOG) injection 0-15 Units  0-15 Units Subcutaneous TID WC Clapacs, Jackquline Denmark, MD   5 Units at 04/28/17 1212  . magnesium hydroxide (MILK OF MAGNESIA) suspension 30 mL  30 mL Oral Daily PRN Clapacs, John T, MD      . metFORMIN (GLUCOPHAGE) tablet 1,000 mg  1,000 mg Oral BID WC Clapacs, Jackquline Denmark, MD   1,000 mg at 04/28/17 0821  . mirtazapine (REMERON) tablet 15 mg  15 mg Oral QHS Clapacs, Jackquline Denmark, MD   15 mg at 04/27/17 2143  . traZODone (DESYREL) tablet 100 mg  100 mg Oral QHS PRN Clapacs, Jackquline Denmark, MD   100 mg at 04/27/17 2144   Current Outpatient Medications  Medication Sig Dispense Refill  . butalbital-acetaminophen-caffeine (FIORICET, ESGIC) 50-325-40 MG tablet Take 1-2 tablets by mouth every 6 (six) hours as needed for  headache. 20 tablet 0  . eletriptan (RELPAX) 20 MG tablet Take 1 tablet (20 mg total) by mouth every 2 (two) hours as needed for migraine or headache. May repeat in 2 hours if headache persists or recurs. 10 tablet 1  . fexofenadine-pseudoephedrine (ALLEGRA-D) 60-120 MG 12 hr tablet Take 1 tablet by mouth 2 (two) times daily. 20 tablet 0  . hydrochlorothiazide (HYDRODIURIL) 25 MG tablet Take 1 tablet (25 mg total) by mouth daily. (Patient taking differently: Take 12.5 mg by mouth daily. ) 30 tablet 0  . metFORMIN (GLUCOPHAGE-XR) 500 MG 24 hr tablet Take 1,000 mg by mouth 2 (two) times daily.    . metoCLOPramide (REGLAN) 10 MG tablet Take 1 tablet (10 mg total) by mouth every 6 (six) hours as needed for nausea. 20 tablet 0  . mirtazapine (REMERON) 15 MG tablet Take 1 tablet (15 mg total) by mouth at bedtime. 30 tablet 1  . traZODone (DESYREL) 100 MG tablet Take 1 tablet (100 mg total) by mouth at bedtime as needed for sleep. 100 tablet 1   PTA Medications: No medications prior to admission.    Patient Stressors: Financial difficulties Health problems Legal issue Marital or family conflict Occupational concerns Traumatic event  Patient Strengths: Ability for insight Average or above average intelligence Capable of independent living Wellsite geologist fund of knowledge  Motivation for treatment/growth Supportive family/friends Work skills  Treatment Modalities: Medication Management, Group therapy, Case management,  1 to 1 session with clinician, Psychoeducation, Recreational therapy.   Physician Treatment Plan for Primary Diagnosis: Severe recurrent major depression without psychotic features (HCC) Long Term Goal(s): Improvement in symptoms so as ready for discharge Improvement in symptoms so as ready for discharge   Short Term Goals: Ability to verbalize feelings will improve Ability to disclose and discuss suicidal ideas Ability to identify and develop effective  coping behaviors will improve Ability to verbalize feelings will improve Ability to disclose and discuss suicidal ideas Ability to identify and develop effective coping behaviors will improve  Medication Management: Evaluate patient's response, side effects, and tolerance of medication regimen.  Therapeutic Interventions: 1 to 1 sessions, Unit Group sessions and Medication administration.  Evaluation of Outcomes: Adequate for Discharge  Physician Treatment Plan for Secondary Diagnosis: Principal Problem:   Severe recurrent major depression without psychotic features (HCC) Active Problems:   Headache   Diabetes mellitus without complication (HCC)   Insomnia due to anxiety and fear   HTN (hypertension)  Long Term Goal(s): Improvement in symptoms so as ready for discharge Improvement in symptoms so as ready for discharge   Short Term Goals: Ability to verbalize feelings will improve Ability to disclose and discuss suicidal ideas Ability to identify and develop effective coping behaviors will improve Ability to verbalize feelings will improve Ability to disclose and discuss suicidal ideas Ability to identify and develop effective coping behaviors will improve     Medication Management: Evaluate patient's response, side effects, and tolerance of medication regimen.  Therapeutic Interventions: 1 to 1 sessions, Unit Group sessions and Medication administration.  Evaluation of Outcomes: Adequate for Discharge   RN Treatment Plan for Primary Diagnosis: Severe recurrent major depression without psychotic features (HCC) Long Term Goal(s): Knowledge of disease and therapeutic regimen to maintain health will improve  Short Term Goals: Ability to identify and develop effective coping behaviors will improve and Compliance with prescribed medications will improve  Medication Management: RN will administer medications as ordered by provider, will assess and evaluate patient's response and  provide education to patient for prescribed medication. RN will report any adverse and/or side effects to prescribing provider.  Therapeutic Interventions: 1 on 1 counseling sessions, Psychoeducation, Medication administration, Evaluate responses to treatment, Monitor vital signs and CBGs as ordered, Perform/monitor CIWA, COWS, AIMS and Fall Risk screenings as ordered, Perform wound care treatments as ordered.  Evaluation of Outcomes: Adequate for Discharge   LCSW Treatment Plan for Primary Diagnosis: Severe recurrent major depression without psychotic features (HCC) Long Term Goal(s): Safe transition to appropriate next level of care at discharge, Engage patient in therapeutic group addressing interpersonal concerns.  Short Term Goals: Engage patient in aftercare planning with referrals and resources, Identify triggers associated with mental health/substance abuse issues and Increase skills for wellness and recovery  Therapeutic Interventions: Assess for all discharge needs, 1 to 1 time with Social worker, Explore available resources and support systems, Assess for adequacy in community support network, Educate family and significant other(s) on suicide prevention, Complete Psychosocial Assessment, Interpersonal group therapy.  Evaluation of Outcomes: Adequate for Discharge   Progress in Treatment: Attending groups: Yes. Participating in groups: Yes. Taking medication as prescribed: Yes. Toleration medication: Yes. Family/Significant other contact made: No, will contact:    Patient understands diagnosis: Yes. Discussing patient identified problems/goals with staff: Yes. Medical problems stabilized or resolved: Yes. Denies suicidal/homicidal ideation: Yes. Issues/concerns per patient self-inventory: Yes. Other:  New problem(s) identified: No, Describe:     New Short Term/Long Term Goal(s):  Discharge Plan or Barriers:   Reason for Continuation of Hospitalization:  Depression Medication stabilization  Estimated Length of Stay: 0days  Attendees: Patient:Vincent Colon [place] 04/28/2017 3:42 PM  Physician: Kristine Linea, MD 04/28/2017 3:42 PM  Nursing: Leonia Reader, RN 04/28/2017 3:42 PM  RN Care Manager: 04/28/2017 3:42 PM  Social Worker: Jake Shark, LCSW 04/28/2017 3:42 PM  Recreational Therapist: Garret Reddish, LRT 04/28/2017 3:42 PM  Other:  04/28/2017 3:42 PM  Other:  04/28/2017 3:42 PM  Other: 04/28/2017 3:42 PM    Scribe for Treatment Team: Glennon Mac, LCSW 04/28/2017 3:42 PM

## 2017-04-28 NOTE — Progress Notes (Signed)
  [facility] Adult Case Management Discharge Plan :  Will you be returning to the same living situation after discharge:  Yes,    At discharge, do you have transportation home?: Yes,    Do you have the ability to pay for your medications: Yes,     Release of information consent forms completed and in the chart;  Patient's signature needed at discharge.  Patient to Follow up at: Follow-up Information    Frankey ShownRobert Milan,LCSW Follow up in 5 day(s).   Why:  Pt reports he has a follow up appointment with his therapist scheduled on 05/01/17 at 9am.  Contact information: [address], [place], [place] [address]  Main Phone: [phone] Fax: [phone] Email: [email or web address]        [facility], [facility]. Go on 04/30/2017.   Why:  Walk in Mon-Friday between 9am-4pm, Please arrive early to minimize wait times. Contact information: [address] [place] [address] [phone]           Next level of care provider has access to [facility] Link:no  Safety Planning and Suicide Prevention discussed: Yes,     Have you used any form of tobacco in the last 30 days? (Cigarettes, Smokeless Tobacco, Cigars, and/or Pipes): No  Has patient been referred to the Quitline?: Not a smoker  Patient has been referred for addiction treatment: Yes  Glennon MacSara P Ilze Roselli, LCSW 04/28/2017, 3:46 PM

## 2017-06-06 ENCOUNTER — Other Ambulatory Visit: Payer: Self-pay | Admitting: Neurology

## 2017-06-06 DIAGNOSIS — F0781 Postconcussional syndrome: Secondary | ICD-10-CM

## 2017-06-16 ENCOUNTER — Ambulatory Visit
Admission: RE | Admit: 2017-06-16 | Discharge: 2017-06-16 | Disposition: A | Discharge status: Home / Self Care | Payer: BLUE CROSS/BLUE SHIELD | Source: Ambulatory Visit | Attending: Neurology | Admitting: Neurology

## 2017-06-16 DIAGNOSIS — F0781 Postconcussional syndrome: Secondary | ICD-10-CM

## 2017-06-26 ENCOUNTER — Emergency Department
Admission: EM | Admit: 2017-06-26 | Discharge: 2017-06-27 | Disposition: A | Discharge status: Home / Self Care | Payer: BLUE CROSS/BLUE SHIELD | Attending: Emergency Medicine | Admitting: Emergency Medicine

## 2017-06-26 ENCOUNTER — Other Ambulatory Visit: Payer: Self-pay

## 2017-06-26 ENCOUNTER — Emergency Department: Payer: BLUE CROSS/BLUE SHIELD

## 2017-06-26 DIAGNOSIS — F331 Major depressive disorder, recurrent, moderate: Secondary | ICD-10-CM | POA: Diagnosis not present

## 2017-06-26 DIAGNOSIS — E119 Type 2 diabetes mellitus without complications: Secondary | ICD-10-CM | POA: Diagnosis not present

## 2017-06-26 DIAGNOSIS — Z79899 Other long term (current) drug therapy: Secondary | ICD-10-CM | POA: Diagnosis not present

## 2017-06-26 DIAGNOSIS — Z7984 Long term (current) use of oral hypoglycemic drugs: Secondary | ICD-10-CM | POA: Diagnosis not present

## 2017-06-26 DIAGNOSIS — G8929 Other chronic pain: Secondary | ICD-10-CM

## 2017-06-26 DIAGNOSIS — F329 Major depressive disorder, single episode, unspecified: Secondary | ICD-10-CM | POA: Diagnosis present

## 2017-06-26 DIAGNOSIS — I10 Essential (primary) hypertension: Secondary | ICD-10-CM | POA: Diagnosis not present

## 2017-06-26 DIAGNOSIS — R51 Headache: Secondary | ICD-10-CM

## 2017-06-26 MED ORDER — HALOPERIDOL LACTATE 5 MG/ML IJ SOLN
5.0000 mg | Freq: Once | INTRAMUSCULAR | Status: AC
  Administered 2017-06-27: 5 mg via INTRAVENOUS
  Filled 2017-06-26: qty 1

## 2017-06-26 MED ORDER — LORAZEPAM 2 MG/ML IJ SOLN
1.0000 mg | Freq: Once | INTRAMUSCULAR | Status: AC
  Administered 2017-06-27: 1 mg via INTRAVENOUS
  Filled 2017-06-26: qty 1

## 2017-06-26 MED ORDER — KETOROLAC TROMETHAMINE 30 MG/ML IJ SOLN
10.0000 mg | Freq: Once | INTRAMUSCULAR | Status: AC
  Administered 2017-06-27: 9.9 mg via INTRAVENOUS
  Filled 2017-06-26: qty 1

## 2017-06-26 NOTE — ED Provider Notes (Signed)
[facility] Emergency Department Provider Note   ____________________________________________   First MD Initiated Contact with Patient 06/26/17 2310     (approximate)  I have reviewed the triage vital signs and the nursing notes.   HISTORY  Chief Complaint Head Injury    HPI Vincent Colon is a 52 y.o. male who presents to the ED from home with a chief complaint of headache and depression.  Patient had a workplace head injury with concussion in December of last year.  He has dealt with intractable headaches since.  Sees a neurologist, neuropsychiatrist, therapist and psychiatrist.  Also has a history of depression; most recently admitted in January to the behavioral health unit.  Presents tonight for global headache and also what he thinks is a cluster headache with tearing to his left eye.  Denies associated fever, vision changes, neck pain, photophobia, neck pain or vomiting.  Denies recent chest pain, shortness of breath, abdominal pain, dysuria, diarrhea.  He was anxious because he noted his elevated blood pressure tonight.  Also states his depression is worse.  Denies active SI/HI/AH/VH.  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   Past Medical History:  Diagnosis Date  . Anxiety   . Depression   . Diabetes mellitus without complication (HCC)   . Hypertension     Patient Active Problem List   Diagnosis Date Noted  . HTN (hypertension) 04/27/2017  . Insomnia due to anxiety and fear   . Severe recurrent major depression without psychotic features (HCC) 04/25/2017  . Headache 04/25/2017  . Diabetes mellitus without complication (HCC) 04/25/2017    Past Surgical History:  Procedure Laterality Date  . APPENDECTOMY      Prior to Admission medications   Medication Sig Start Date End Date Taking? Authorizing Provider  hydrochlorothiazide (HYDRODIURIL) 25 MG tablet Take 1 tablet  (25 mg total) by mouth daily. 07/24/15  Yes Sharman Cheek, MD  metFORMIN (GLUCOPHAGE-XR) 500 MG 24 hr tablet Take 1,000 mg by mouth 2 (two) times daily.   Yes [provider]  Multiple Vitamin (MULTIVITAMIN WITH MINERALS) TABS tablet Take 1 tablet by mouth daily.   Yes [provider]  omega-3 acid ethyl esters (LOVAZA) 1 g capsule Take 2 g by mouth daily.   Yes [provider]    Allergies Patient has no known allergies.  No family history on file.  Social History Social History   Tobacco Use  . Smoking status: Never Smoker  . Smokeless tobacco: Never Used  Substance Use Topics  . Alcohol use: No  . Drug use: No    Review of Systems  Constitutional: No fever/chills. Eyes: No visual changes. ENT: No sore throat. Cardiovascular: Denies chest pain. Respiratory: Denies shortness of breath. Gastrointestinal: No abdominal pain.  No nausea, no vomiting.  No diarrhea.  No constipation. Genitourinary: Negative for dysuria. Musculoskeletal: Negative for back pain. Skin: Negative for rash. Neurological: Positive for headache.  Negative for focal weakness or numbness. Psychiatric:Positive for depression.   ____________________________________________   PHYSICAL EXAM:  VITAL SIGNS: ED Triage Vitals  Enc Vitals Group     BP 06/26/17 2207 (!) 186/116     Pulse Rate 06/26/17 2207 94     Resp 06/26/17 2207 18     Temp 06/26/17 2207 97.9 F (36.6 C)     Temp Source 06/26/17 2207 Oral     SpO2 06/26/17 2207 98 %     Weight  06/26/17 2209 220 lb (99.8 kg)     Height 06/26/17 2209 5\' 11"  (1.803 m)     Head Circumference --      Peak Flow --      Pain Score 06/26/17 2208 10     Pain Loc --      Pain Edu? --      Excl. in GC? --     Constitutional: Alert and oriented. Well appearing and in no acute distress.  Tearful.  Anxious. Eyes: Conjunctivae are normal. PERRL. EOMI. Mild tearing from left eye. Head: Atraumatic. Nose: No  congestion/rhinnorhea. Mouth/Throat: Mucous membranes are moist.  Oropharynx non-erythematous. Neck: No stridor.  Supple neck without meningismus.  No carotid bruits. Cardiovascular: Normal rate, regular rhythm. Grossly normal heart sounds.  Good peripheral circulation. Respiratory: Normal respiratory effort.  No retractions. Lungs CTAB. Gastrointestinal: Soft and nontender. No distention. No abdominal bruits. No CVA tenderness. Musculoskeletal: No lower extremity tenderness nor edema.  No joint effusions. Neurologic:  Normal speech and language. No gross focal neurologic deficits are appreciated. No gait instability. Skin:  Skin is warm, dry and intact. No rash noted.  No petechiae. Psychiatric: Mood and affect are stressed, tearful. Speech and behavior are normal.  ____________________________________________   LABS (all labs ordered are listed, but only abnormal results are displayed)  Labs Reviewed  COMPREHENSIVE METABOLIC PANEL - Abnormal; Notable for the following components:      Result Value   Chloride 99 (*)    Glucose, Bld 252 (*)    Total Bilirubin 1.5 (*)    All other components within normal limits  URINALYSIS, COMPLETE (UACMP) WITH MICROSCOPIC - Abnormal; Notable for the following components:   Color, Urine YELLOW (*)    APPearance CLEAR (*)    Glucose, UA >=500 (*)    Hgb urine dipstick SMALL (*)    Protein, ur 30 (*)    All other components within normal limits  CBC WITH DIFFERENTIAL/PLATELET  TROPONIN I  URINE DRUG SCREEN, QUALITATIVE ([facility] ONLY)   ____________________________________________  EKG  ED ECG REPORT I, Jodette Wik J, the attending physician, personally viewed and interpreted this ECG.   Date: 06/27/2017  EKG Time: 0018  Rate: 79  Rhythm: normal EKG, normal sinus rhythm  Axis: Normal  Intervals:none  ST&T Change: Nonspecific  ____________________________________________  RADIOLOGY  ED MD interpretation: No acute cardiopulmonary  process  Official radiology report(s): Dg Chest Port 1 View  Result Date: 06/27/2017 CLINICAL DATA:  Hypertension. EXAM: PORTABLE CHEST 1 VIEW COMPARISON:  03/20/2017 FINDINGS: The cardiomediastinal contours are normal. The lungs are clear. Pulmonary vasculature is normal. No consolidation, pleural effusion, or pneumothorax. No acute osseous abnormalities are seen. IMPRESSION: No acute abnormality. Electronically Signed   By: Rubye Oaks M.D.   On: 06/27/2017 00:35    ____________________________________________   PROCEDURES  Procedure(s) performed: None  Procedures  Critical Care performed: No  ____________________________________________   INITIAL IMPRESSION / ASSESSMENT AND PLAN / ED COURSE  As part of my medical decision making, I reviewed the following data within the electronic MEDICAL RECORD NUMBER Nursing notes reviewed and incorporated, Labs reviewed, EKG interpreted, Old chart reviewed, Radiograph reviewed, A consult was requested and obtained from this/these consultant(s) Psychiatry and Notes from prior ED visits   52 year old male with depression who is dealing with intractable headaches stemming from a closed head injury with postconcussive syndrome.  Also being evaluated for cervical radiculopathy.  Has a tremendous stress low dealing with Workmen's Comp., lawyers as well as health issues.  Just  this week he has seen his therapist, neurologist and neuropsychiatrist and feels frustrated because he feels like nobody is listening to him or helping him.  Had a recent brain MRI which I was able to review the results in the computer; unremarkable brain.  I had a extensive discussion with the patient who agreed with the following plan to treat his headache with IV medications as well as nonrebreather oxygen, psychiatric [facility].  At this time patient is voluntary and contracts for safety while in the emergency department.   Clinical Course as of Jun 28 819  Fri Jun 27, 2017  0136 Headache down to 4/10 after oxygen and medications.  Remains hypertensive.  Will administer hydralazine.  Laboratory, urinalysis and chest x-ray results noted.  [JS]  0450 [facility] Dr[facility] Jacky KindlePenalver who does recommend inpatient psychiatric hospitalization.  Recommends Remeron 15 mg nightly, trazodone 100 mg nightly and Ativan 2 mg as needed for anxiety.  [JS]  [id number] Patient sleeping no acute distress.  Initial hydralazine which was ordered approximately 1:45 AM was held because patient's blood pressure normalized.  It was given approximately 6 AM for elevated blood pressure.  Currently blood pressure improved.  Patient awaiting in-house psychiatric evaluation.  [JS]    Clinical Course User Index [JS] Irean HongSung, Viraaj Vorndran J, MD     ____________________________________________   FINAL CLINICAL IMPRESSION(S) / ED DIAGNOSES  Final diagnoses:  Chronic nonintractable headache, unspecified headache type  Moderate episode of recurrent major depressive disorder [facility](HCC)  Essential hypertension     ED Discharge Orders    None       Note:  This document was prepared using Dragon voice recognition software and may include unintentional dictation errors.    Irean HongSung, Easton Fetty J, MD 06/27/17 [phone]

## 2017-06-26 NOTE — ED Triage Notes (Signed)
Pt had head injury at work in Dec and was dx with concussion. Has been seen by multiple doctors for the same including this ED. States has had headache since, not able to sleep. Has seen several neurologists for the same and had MRI done Monday but has not gotten results.

## 2017-06-26 NOTE — ED Notes (Signed)
Patient c/o headache post concussion in December. Patient reports pain is in area of original injury. Patient c/o pain behind left eye described as pressure. Patient also reports hx of chronic neck pain.

## 2017-06-26 NOTE — ED Notes (Signed)
ED Provider at bedside. 

## 2017-06-27 LAB — CBC WITH DIFFERENTIAL/PLATELET
BASOS ABS: 0.1 10*3/uL (ref 0–0.1)
BASOS PCT: 1 %
EOS ABS: 0.2 10*3/uL (ref 0–0.7)
EOS PCT: 3 %
HCT: 43.6 % (ref 40.0–52.0)
HEMOGLOBIN: 14.9 g/dL (ref 13.0–18.0)
LYMPHS ABS: 2.8 10*3/uL (ref 1.0–3.6)
Lymphocytes Relative: 39 %
MCH: 30.5 pg (ref 26.0–34.0)
MCHC: 34.1 g/dL (ref 32.0–36.0)
MCV: 89.4 fL (ref 80.0–100.0)
Monocytes Absolute: 0.8 10*3/uL (ref 0.2–1.0)
Monocytes Relative: 10 %
NEUTROS PCT: 47 %
Neutro Abs: 3.5 10*3/uL (ref 1.4–6.5)
Platelets: 276 10*3/uL (ref 150–440)
RBC: 4.88 MIL/uL (ref 4.40–5.90)
RDW: 13.3 % (ref 11.5–14.5)
WBC: 7.3 10*3/uL (ref 3.8–10.6)

## 2017-06-27 LAB — URINALYSIS, COMPLETE (UACMP) WITH MICROSCOPIC
Bacteria, UA: NONE SEEN
Bilirubin Urine: NEGATIVE
Glucose, UA: >=500 mg/dL — AB
Ketones, ur: NEGATIVE mg/dL
Leukocytes, UA: NEGATIVE
NITRITE: NEGATIVE
PROTEIN: 30 mg/dL — AB
Specific Gravity, Urine: 1.028 (ref 1.005–1.030)
Squamous Epithelial / LPF: NONE SEEN
pH: 6 (ref 5.0–8.0)

## 2017-06-27 LAB — URINE DRUG SCREEN, QUALITATIVE (ARMC ONLY)
Amphetamines, Ur Screen: NOT DETECTED
BARBITURATES, UR SCREEN: NOT DETECTED
BENZODIAZEPINE, UR SCRN: NOT DETECTED
CANNABINOID 50 NG, UR ~~LOC~~: NOT DETECTED
Cocaine Metabolite,Ur ~~LOC~~: NOT DETECTED
MDMA (Ecstasy)Ur Screen: NOT DETECTED
METHADONE SCREEN, URINE: NOT DETECTED
Opiate, Ur Screen: NOT DETECTED
Phencyclidine (PCP) Ur S: NOT DETECTED
TRICYCLIC, UR SCREEN: NOT DETECTED

## 2017-06-27 LAB — COMPREHENSIVE METABOLIC PANEL
ALBUMIN: 4.2 g/dL (ref 3.5–5.0)
ALK PHOS: 58 U/L (ref 38–126)
ALT: 21 U/L (ref 17–63)
ANION GAP: 10 (ref 5–15)
AST: 22 U/L (ref 15–41)
BUN: 19 mg/dL (ref 6–20)
CHLORIDE: 99 mmol/L — AB (ref 101–111)
CO2: 27 mmol/L (ref 22–32)
CREATININE: 0.84 mg/dL (ref 0.61–1.24)
Calcium: 9.4 mg/dL (ref 8.9–10.3)
GFR calc non Af Amer: >60 mL/min (ref >60)
Glucose, Bld: 252 mg/dL — ABNORMAL HIGH (ref 65–99)
Potassium: 4 mmol/L (ref 3.5–5.1)
SODIUM: 136 mmol/L (ref 135–145)
Total Bilirubin: 1.5 mg/dL — ABNORMAL HIGH (ref 0.3–1.2)
Total Protein: 7.6 g/dL (ref 6.5–8.1)

## 2017-06-27 LAB — TROPONIN I: Troponin I: <0.03 ng/mL (ref <0.03)

## 2017-06-27 MED ORDER — LORAZEPAM 2 MG PO TABS
2.0000 mg | ORAL_TABLET | Freq: Three times a day (TID) | ORAL | Status: DC | PRN
  Filled 2017-06-27: qty 1

## 2017-06-27 MED ORDER — HYDROCHLOROTHIAZIDE 25 MG PO TABS
25.0000 mg | ORAL_TABLET | Freq: Every day | ORAL | Status: DC
  Filled 2017-06-27: qty 1

## 2017-06-27 MED ORDER — MIRTAZAPINE 15 MG PO TABS: 15.0000 mg | ORAL_TABLET | Freq: Every day | ORAL | Status: DC

## 2017-06-27 MED ORDER — TRAZODONE HCL 100 MG PO TABS: 100.0000 mg | ORAL_TABLET | Freq: Every day | ORAL | Status: DC

## 2017-06-27 MED ORDER — HYDRALAZINE HCL 20 MG/ML IJ SOLN
10.0000 mg | Freq: Once | INTRAMUSCULAR | Status: AC
  Administered 2017-06-27: 10 mg via INTRAVENOUS
  Filled 2017-06-27: qty 1

## 2017-06-27 MED ORDER — METFORMIN HCL ER 500 MG PO TB24
1000.0000 mg | ORAL_TABLET | Freq: Two times a day (BID) | ORAL | Status: DC
  Filled 2017-06-27: qty 2

## 2017-06-27 MED ORDER — HYDRALAZINE HCL 20 MG/ML IJ SOLN: 10.0000 mg | Freq: Once | INTRAMUSCULAR | Status: DC

## 2017-06-27 NOTE — ED Notes (Signed)
Patient taken on nonrebreather

## 2017-06-27 NOTE — ED Notes (Signed)
Patient placed on nonrebreather per MD Dolores FrameSung for 45 minutes

## 2017-06-27 NOTE — BH Assessment (Signed)
Assessment Note  Vincent Colon is an 52 y.o. male. Patient initially presents with complaints of chronic headache following a concussion. He report's previous history of suicide attempts as a teenager, an overdose following a traumatic incident. Patients most recent inpatient Behavioral Health admission occured at [facility] in January 2019 for suicidality following a work-related incident. Patient recently diagnosed with post-concussion syndrome. He presents as flat and restricted. It is of note that there is a genetic predisposition to mood disorder as a patient states his father committed suicide when he was 46. He reports insomnia and constant irritability due to lack of sleep. He reports of decreased  and inability to maintain restful sleep. Patient states he sleeps approximately two to three hours per night. He also reports being overwhelmed and flooded with emotions as he's currently going through legality surrounding his recent injury. He reports complying and outpatient Mental Health Services as well as medication compliance. Pt begin Med Management in December of last year Imports some elevation in mood. Pt. denies any suicidal ideation, plan or intent. Pt. denies the presence of any auditory or visual hallucinations at this time. Patient denies any other medical complaints.    Diagnosis: Depression   Past Medical History:  Past Medical History:  Diagnosis Date  . Anxiety   . Depression   . Diabetes mellitus without complication (HCC)   . Hypertension     Past Surgical History:  Procedure Laterality Date  . APPENDECTOMY      Family History: No family history on file.  Social History:  reports that  has never smoked. he has never used smokeless tobacco. He reports that he does not drink alcohol or use drugs.  Additional Social History:  Alcohol / Drug Use Pain Medications: See MAR Prescriptions: See MAR Over the Counter: See MAR History of alcohol / drug use?: No  history of alcohol / drug abuse  CIWA: CIWA-Ar BP: (!) 120/96 Pulse Rate: 91 COWS:    Allergies: No Known Allergies  Home Medications:  (Not in a hospital admission)  OB/GYN Status:  No LMP for male patient.  General Assessment Data Location of Assessment: [facility] ED TTS Assessment: In system Is this a Tele or Face-to-Face Assessment?: Face-to-Face Is this an Initial Assessment or a Re-assessment for this encounter?: Initial Assessment Marital status: Single Living Arrangements: Other relatives Can pt return to current living arrangement?: Yes Admission Status: Voluntary Is patient capable of signing voluntary admission?: Yes Referral Source: Self/Family/Friend Insurance type: BCBS  Medical Screening Exam [employer] Walk-in ONLY) Medical Exam completed: Yes  Crisis Care Plan Living Arrangements: Other relatives Legal Guardian: Other:(NONE ) Name of Psychiatrist: Recent assessment  Name of Therapist: Dr Daiva Eves    Education Status Is patient currently in school?: No Is the patient employed, unemployed or receiving disability?: Employed(Out on Leave for injury )  Risk to self with the past 6 months Suicidal Ideation: No Has patient been a risk to self within the past 6 months prior to admission? : No Suicidal Intent: No Has patient had any suicidal intent within the past 6 months prior to admission? : No Is patient at risk for suicide?: No, but patient needs Medical Clearance Suicidal Plan?: No Has patient had any suicidal plan within the past 6 months prior to admission? : No Access to Means: No What has been your use of drugs/alcohol within the last 12 months?: None  Previous Attempts/Gestures: No How many times?: 1 Other Self Harm Risks: none Triggers for Past Attempts: Unknown  Intentional Self Injurious Behavior: None Family Suicide History: Yes Recent stressful life event(s): Turmoil (Comment), Recent negative physical changes, Job Loss Persecutory  voices/beliefs?: No Depression: Yes Depression Symptoms: Loss of interest in usual pleasures, Feeling angry/irritable, Insomnia Substance abuse history and/or treatment for substance abuse?: No Suicide prevention information given to non-admitted patients: Yes  Risk to Others within the past 6 months Homicidal Ideation: No Does patient have any lifetime risk of violence toward others beyond the six months prior to admission? : No Thoughts of Harm to Others: No Current Homicidal Intent: No Current Homicidal Plan: No Access to Homicidal Means: No Identified Victim: No History of harm to others?: No Assessment of Violence: None Noted Violent Behavior Description: N/a Does patient have access to weapons?: No Criminal Charges Pending?: No Does patient have a court date: No Is patient on probation?: No  Psychosis Hallucinations: None noted Delusions: None noted  Mental Status Report Appearance/Hygiene: In scrubs Eye Contact: Good Motor Activity: Freedom of movement Speech: Logical/coherent Level of Consciousness: Alert Mood: Sad, Helpless Affect: Sad, Depressed Anxiety Level: None Thought Processes: Coherent Judgement: Unimpaired Orientation: Person, Place, Situation, Time Obsessive Compulsive Thoughts/Behaviors: None  Cognitive Functioning Concentration: Normal Memory: Recent Intact, Remote Intact Is patient IDD: No Is patient DD?: No Insight: Fair Impulse Control: Good Appetite: Fair Have you had any weight changes? : No Change Sleep: Decreased Total Hours of Sleep: 3 Vegetative Symptoms: None  ADLScreening [facility] Assessment Services) Patient's cognitive ability adequate to safely complete daily activities?: Yes Patient able to express need for assistance with ADLs?: Yes Independently performs ADLs?: Yes (appropriate for developmental age)  Prior Inpatient Therapy Prior Inpatient Therapy: Yes Prior Therapy Dates: 04/2017 [facility]   Reason for Treatment: SI  Prior Outpatient Therapy Prior Outpatient Therapy: Yes Prior Therapy Dates: Current  Prior Therapy Facilty/Provider(s): [facility] Mulian, LCSW Reason for Treatment: Depression  Does patient have an ACCT team?: No Does patient have Intensive In-House Services?  : No Does patient have [facility] services? : No  ADL Screening (condition at time of admission) Patient's cognitive ability adequate to safely complete daily activities?: Yes Patient able to express need for assistance with ADLs?: Yes Independently performs ADLs?: Yes (appropriate for developmental age)       Abuse/Neglect Assessment (Assessment to be complete while patient is alone) Abuse/Neglect Assessment Can Be Completed: Yes Physical Abuse: Denies Verbal Abuse: Denies Sexual Abuse: Denies Exploitation of patient/patient's resources: Denies Self-Neglect: Denies   Consults Spiritual Care Consult Needed: No Social Work Consult Needed: No      Additional Information 1:1 In Past 12 Months?: No CIRT Risk: No Elopement Risk: No Does patient have medical clearance?: Yes     Disposition:  Disposition Initial Assessment Completed for this Encounter: Yes Patient referred to: Other (Comment)(AM re-evaluation )  On Site Evaluation by:   Reviewed with Physician:    Asa SaunasShawanna N Jezel Basto 06/27/2017 4:44 AM

## 2017-06-27 NOTE — Discharge Instructions (Signed)
Please seek medical attention and help for any thoughts about wanting to harm yourself, harm others, any concerning change in behavior, severe depression, inappropriate drug use or any other new or concerning symptoms. ° °

## 2017-06-27 NOTE — ED Provider Notes (Signed)
Patient stating that he would like to go home at this time.  He is currently voluntary.  I had a discussion with the patient.  At this point he denies any suicidal ideation.  He does state that he knows to come back if he starts having thoughts along those lines.  He is calm.  Will discharge.   Phineas SemenGoodman, Bobbette Eakes, MD 06/27/17 [phone]

## 2017-06-27 NOTE — ED Notes (Signed)
Pt states his HA is gone and is feeling better and just wants to go home, mother is at the bedside. Pt states if he gets into a behavioral crisis he will come back to the ED for treatment.. EDP notified and is going to speak with the pt about the plan of care..Marland Kitchen

## 2017-06-27 NOTE — ED Notes (Signed)
Spoke with Dr. Dolores FrameSung about moving pt to quad, states she would prefer pt stay in the room at present due to the fact his is voluntary and is concerned he will become agitated and try to leave before evaluated by psychiatry.

## 2017-11-11 ENCOUNTER — Other Ambulatory Visit: Payer: Self-pay | Admitting: Family Medicine

## 2017-11-11 DIAGNOSIS — K5900 Constipation, unspecified: Secondary | ICD-10-CM

## 2017-11-11 DIAGNOSIS — R1031 Right lower quadrant pain: Secondary | ICD-10-CM

## 2017-11-18 ENCOUNTER — Ambulatory Visit
Admission: RE | Admit: 2017-11-18 | Discharge: 2017-11-18 | Disposition: A | Discharge status: Home / Self Care | Payer: BLUE CROSS/BLUE SHIELD | Source: Ambulatory Visit | Attending: Family Medicine | Admitting: Family Medicine

## 2017-11-18 DIAGNOSIS — K5900 Constipation, unspecified: Secondary | ICD-10-CM | POA: Diagnosis present

## 2017-11-18 DIAGNOSIS — R1031 Right lower quadrant pain: Secondary | ICD-10-CM | POA: Insufficient documentation

## 2017-11-21 ENCOUNTER — Ambulatory Visit: Payer: Self-pay | Admitting: Gastroenterology

## 2018-01-01 ENCOUNTER — Other Ambulatory Visit: Payer: Self-pay

## 2018-01-01 ENCOUNTER — Encounter: Payer: Self-pay | Admitting: Gastroenterology

## 2018-01-01 ENCOUNTER — Ambulatory Visit (INDEPENDENT_AMBULATORY_CARE_PROVIDER_SITE_OTHER): Payer: BLUE CROSS/BLUE SHIELD | Admitting: Gastroenterology

## 2018-01-01 VITALS — BP 137/80 | HR 102 | Ht 71.0 in | Wt 219.2 lb

## 2018-01-01 DIAGNOSIS — K5900 Constipation, unspecified: Secondary | ICD-10-CM

## 2018-01-01 DIAGNOSIS — Z1211 Encounter for screening for malignant neoplasm of colon: Secondary | ICD-10-CM

## 2018-01-01 NOTE — Progress Notes (Signed)
Wyline Mood MD, MRCP(U.K) [address]  [address]  [place], [place] [address]  Main: [phone]  Fax: [phone]   Gastroenterology Consultation  Referring Provider:     [facility] Primary Care Physician:  Patient, No Pcp Per Primary Gastroenterologist:  Dr. Wyline Mood  Reason for Consultation:     Change in bowel habits         HPI:   Vincent Colon is a 52 y.o. y/o male referred for RLQ pain, change in bowel habits and constipation.   He says that after he started his glipizide , developed constipation . Constipation started after glipizide was started in 05/2017 , gained weight . Had abdominal bloating , pain in the RLQ , would get better after a bowel movement , he only had a bowel movement once a week . Became dependant on laxatives, once he stopped the glipizide, the bowel movements returned back to normal .   Presently has 2-3 bowel movements a week which is his normal and no other symptoms. Has not had a colonoscopy. No family history of colon cancer or polyps.    Past Medical History:  Diagnosis Date  . Anxiety   . Depression   . Diabetes mellitus without complication (HCC)   . Hypertension     Past Surgical History:  Procedure Laterality Date  . APPENDECTOMY      Prior to Admission medications   Medication Sig Start Date End Date Taking? Authorizing Provider  hydrochlorothiazide (HYDRODIURIL) 25 MG tablet Take 1 tablet (25 mg total) by mouth daily. 07/24/15   Sharman Cheek, MD  metFORMIN (GLUCOPHAGE-XR) 500 MG 24 hr tablet Take 1,000 mg by mouth 2 (two) times daily.    [provider]  Multiple Vitamin (MULTIVITAMIN WITH MINERALS) TABS tablet Take 1 tablet by mouth daily.    [provider]  omega-3 acid ethyl esters (LOVAZA) 1 g capsule Take 2 g by mouth daily.    [provider]    No family history on file.   Social History   Tobacco Use  . Smoking status: Never Smoker  . Smokeless tobacco: Never  Used  Substance Use Topics  . Alcohol use: No  . Drug use: No    Allergies as of 01/01/2018  . (No Known Allergies)    Review of Systems:    All systems reviewed and negative except where noted in HPI.   Physical Exam:  BP 137/80   Pulse (!) 102   Ht 5\' 11"  (1.803 m)   Wt 219 lb 3.2 oz (99.4 kg)   BMI 30.57 kg/m  No LMP for male patient. Psych:  Alert and cooperative. Normal mood and affect. General:   Alert,  Well-developed, well-nourished, pleasant and cooperative in NAD Head:  Normocephalic and atraumatic. Eyes:  Sclera clear, no icterus.   Conjunctiva pink. Ears:  Normal auditory acuity. Nose:  No deformity, discharge, or lesions. Mouth:  No deformity or lesions,oropharynx pink & moist. Neck:  Supple; no masses or thyromegaly. Lungs:  Respirations even and unlabored.  Clear throughout to auscultation.   No wheezes, crackles, or rhonchi. No acute distress. Heart:  Regular rate and rhythm; no murmurs, clicks, rubs, or gallops. Abdomen:  Normal bowel sounds.  No bruits.  Soft, non-tender and non-distended without masses, hepatosplenomegaly or hernias noted.  No guarding or rebound tenderness.    Neurologic:  Alert and oriented x3;  grossly normal neurologically. Skin:  Intact without significant lesions or rashes. No jaundice. Lymph Nodes:  No significant cervical adenopathy. Psych:  Alert and cooperative. Normal mood and affect.  Imaging Studies: No results found.  Assessment and Plan:   Vincent Colon is a 52 y.o. y/o male has been referred for constipation , bloating , RLQ pain . All symptoms resolved after he stopped his glipizide. He is due for colon cancer screening , average risk   Plan  1. Colonoscopy  2. High fiber diet   I have discussed alternative options, risks & benefits,  which include, but are not limited to, bleeding, infection, perforation,respiratory complication & drug reaction.  The patient agrees with this plan & written consent will be  obtained.     Follow up PRN  Dr Wyline MoodKiran Nader Boys MD,MRCP(U.K)

## 2018-01-01 NOTE — Patient Instructions (Signed)
High-Fiber Diet  Fiber, also called dietary fiber, is a type of carbohydrate found in fruits, vegetables, whole grains, and beans. A high-fiber diet can have many health benefits. Your health care provider may recommend a high-fiber diet to help:  · Prevent constipation. Fiber can make your bowel movements more regular.  · Lower your cholesterol.  · Relieve hemorrhoids, uncomplicated diverticulosis, or irritable bowel syndrome.  · Prevent overeating as part of a weight-loss plan.  · Prevent heart disease, type 2 diabetes, and certain cancers.    What is my plan?  The recommended daily intake of fiber includes:  · 38 grams for men under age 50.  · 30 grams for men over age 50.  · 25 grams for women under age 50.  · 21 grams for women over age 50.    You can get the recommended daily intake of dietary fiber by eating a variety of fruits, vegetables, grains, and beans. Your health care provider may also recommend a fiber supplement if it is not possible to get enough fiber through your diet.  What do I need to know about a high-fiber diet?  · Fiber supplements have not been widely studied for their effectiveness, so it is better to get fiber through food sources.  · Always check the fiber content on the nutrition facts label of any prepackaged food. Look for foods that contain at least 5 grams of fiber per serving.  · Ask your dietitian if you have questions about specific foods that are related to your condition, especially if those foods are not listed in the following section.  · Increase your daily fiber consumption gradually. Increasing your intake of dietary fiber too quickly may cause bloating, cramping, or gas.  · Drink plenty of water. Water helps you to digest fiber.  What foods can I eat?  Grains  Whole-grain breads. Multigrain cereal. Oats and oatmeal. Brown rice. Barley. Bulgur wheat. Millet. Bran muffins. Popcorn. Rye wafer crackers.  Vegetables   Sweet potatoes. Spinach. Kale. Artichokes. Cabbage. Broccoli. Green peas. Carrots. Squash.  Fruits  Berries. Pears. Apples. Oranges. Avocados. Prunes and raisins. Dried figs.  Meats and Other Protein Sources  Navy, kidney, pinto, and soy beans. Split peas. Lentils. Nuts and seeds.  Dairy  Fiber-fortified yogurt.  Beverages  Fiber-fortified soy milk. Fiber-fortified orange juice.  Other  Fiber bars.  The items listed above may not be a complete list of recommended foods or beverages. Contact your dietitian for more options.  What foods are not recommended?  Grains  White bread. Pasta made with refined flour. White rice.  Vegetables  Fried potatoes. Canned vegetables. Well-cooked vegetables.  Fruits  Fruit juice. Cooked, strained fruit.  Meats and Other Protein Sources  Fatty cuts of meat. Fried poultry or fried fish.  Dairy  Milk. Yogurt. Cream cheese. Sour cream.  Beverages  Soft drinks.  Other  Cakes and pastries. Butter and oils.  The items listed above may not be a complete list of foods and beverages to avoid. Contact your dietitian for more information.  What are some tips for including high-fiber foods in my diet?  · Eat a wide variety of high-fiber foods.  · Make sure that half of all grains consumed each day are whole grains.  · Replace breads and cereals made from refined flour or white flour with whole-grain breads and cereals.  · Replace white rice with brown rice, bulgur wheat, or millet.  · Start the day with a breakfast that is high in fiber,   such as a cereal that contains at least 5 grams of fiber per serving.  · Use beans in place of meat in soups, salads, or pasta.  · Eat high-fiber snacks, such as berries, raw vegetables, nuts, or popcorn.  This information is not intended to replace advice given to you by your health care provider. Make sure you discuss any questions you have with your health care provider.  Document Released: 04/08/2005 Document Revised: 09/14/2015 Document Reviewed: 09/21/2013   Elsevier Interactive Patient Education © 2018 [employer] Inc.

## 2019-10-22 DIAGNOSIS — M7552 Bursitis of left shoulder: Secondary | ICD-10-CM | POA: Diagnosis not present

## 2019-10-22 DIAGNOSIS — R2689 Other abnormalities of gait and mobility: Secondary | ICD-10-CM | POA: Diagnosis not present

## 2019-10-22 DIAGNOSIS — F419 Anxiety disorder, unspecified: Secondary | ICD-10-CM | POA: Diagnosis not present

## 2019-10-22 DIAGNOSIS — F331 Major depressive disorder, recurrent, moderate: Secondary | ICD-10-CM | POA: Diagnosis not present

## 2019-10-22 DIAGNOSIS — Z6828 Body mass index (BMI) 28.0-28.9, adult: Secondary | ICD-10-CM | POA: Diagnosis not present

## 2019-10-22 DIAGNOSIS — S0990XS Unspecified injury of head, sequela: Secondary | ICD-10-CM | POA: Diagnosis not present

## 2019-10-22 DIAGNOSIS — F0781 Postconcussional syndrome: Secondary | ICD-10-CM | POA: Diagnosis not present

## 2019-10-25 DIAGNOSIS — F4323 Adjustment disorder with mixed anxiety and depressed mood: Secondary | ICD-10-CM | POA: Diagnosis not present

## 2019-10-28 DIAGNOSIS — F39 Unspecified mood [affective] disorder: Secondary | ICD-10-CM | POA: Diagnosis not present

## 2019-10-28 DIAGNOSIS — F332 Major depressive disorder, recurrent severe without psychotic features: Secondary | ICD-10-CM | POA: Diagnosis not present

## 2019-10-28 DIAGNOSIS — Z6828 Body mass index (BMI) 28.0-28.9, adult: Secondary | ICD-10-CM | POA: Diagnosis not present

## 2019-11-03 DIAGNOSIS — F4323 Adjustment disorder with mixed anxiety and depressed mood: Secondary | ICD-10-CM | POA: Diagnosis not present

## 2019-11-09 DIAGNOSIS — F4323 Adjustment disorder with mixed anxiety and depressed mood: Secondary | ICD-10-CM | POA: Diagnosis not present

## 2019-11-16 DIAGNOSIS — F4323 Adjustment disorder with mixed anxiety and depressed mood: Secondary | ICD-10-CM | POA: Diagnosis not present

## 2019-11-16 IMAGING — MR MR CERVICAL SPINE W/O CM
5 series · 33 of 48 positions shown · non-contrast
Comparison: None.

CLINICAL DATA: Left arm pain and weakness.

EXAM:
MRI CERVICAL SPINE WITHOUT CONTRAST
TECHNIQUE: Multiplanar, multisequence MR imaging of the cervical spine was
performed. No intravenous contrast was administered.

[Series 2: T2 · sagittal · 3.0mm · 0.56mm/px · 6 of 13 slices shown (1 of 2)]
[im 1/13]
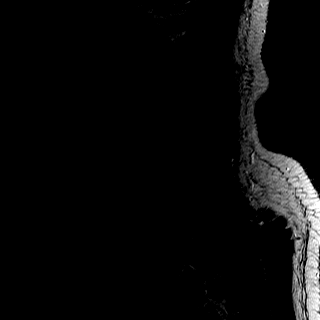
[im 3/13]
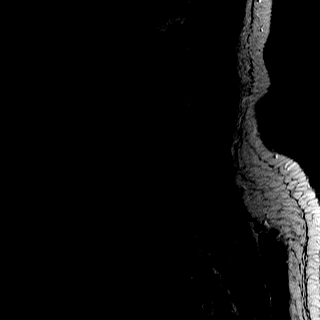
[im 5/13]
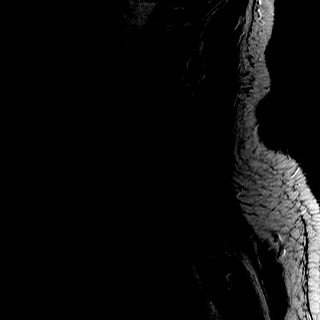
[im 8/13]
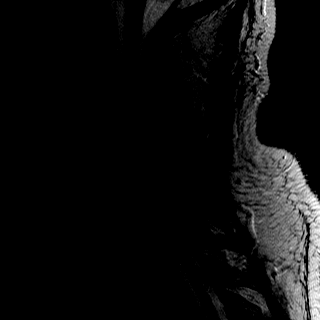
[im 10/13]
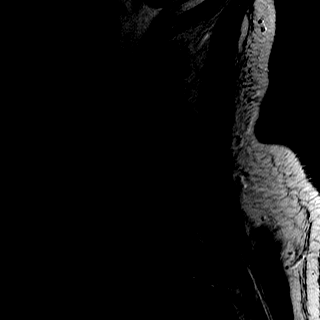
[im 13/13]
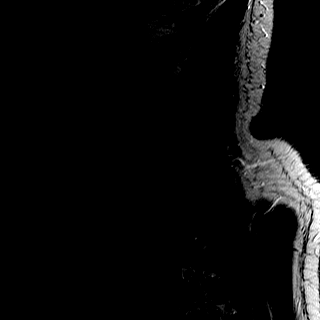

[Series 3: STIR · sagittal · 3.0mm · 0.35mm/px · 7 of 13 slices shown]
[im 1/13]
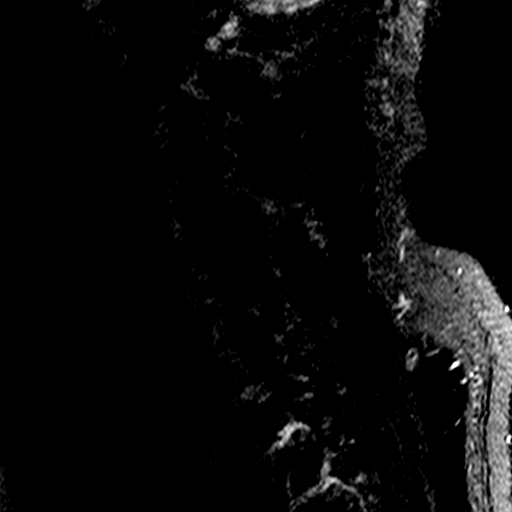
[im 3/13]
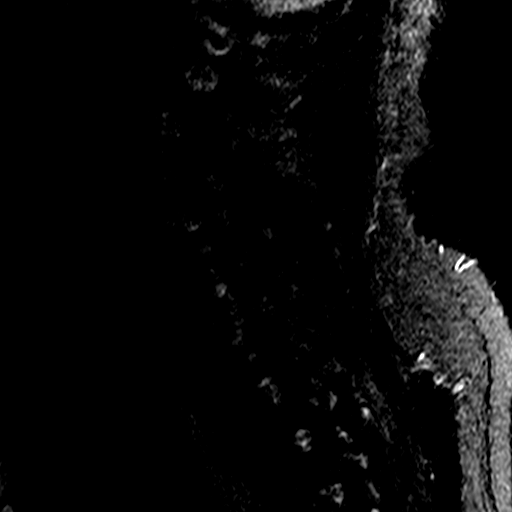
[im 5/13]
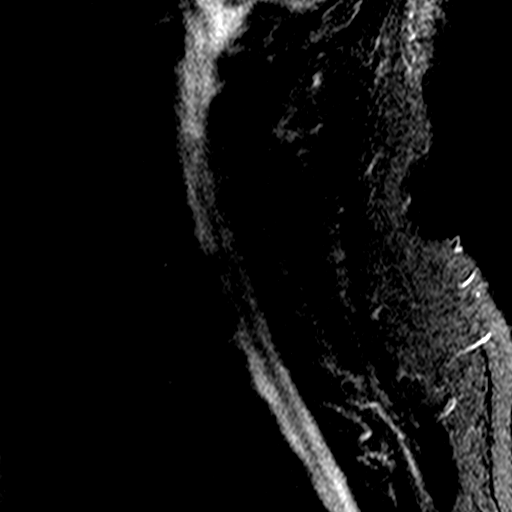
[im 7/13]
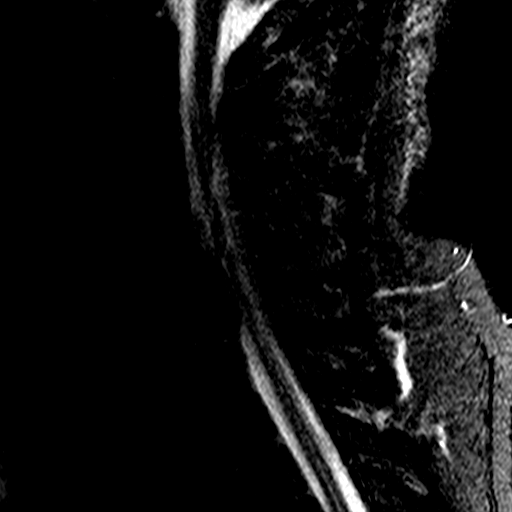
[im 9/13]
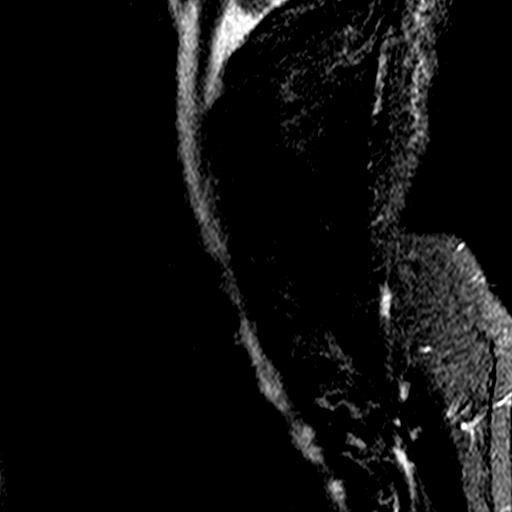
[im 11/13]
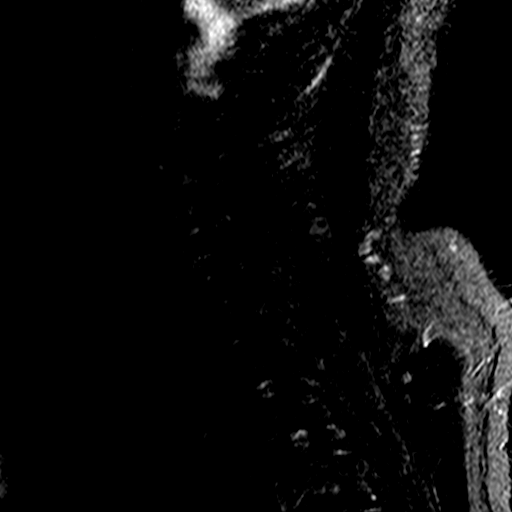
[im 13/13]
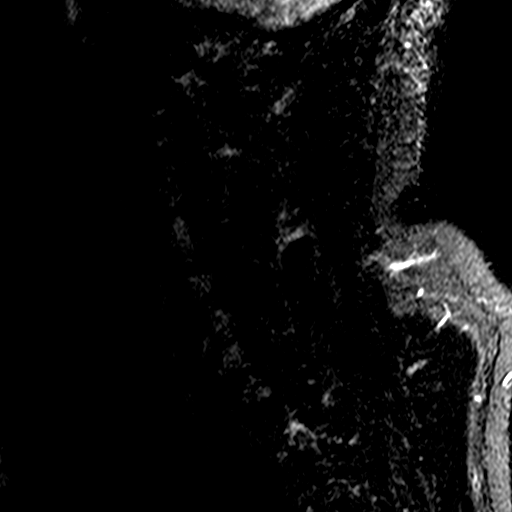

[Series 4: T1 · sagittal · 3.0mm · 0.70mm/px · 7 of 13 slices shown]
[im 1/13]
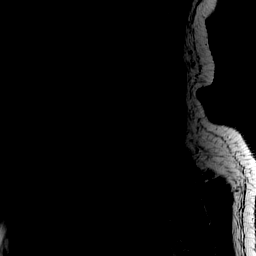
[im 3/13]
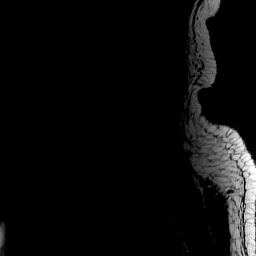
[im 5/13]
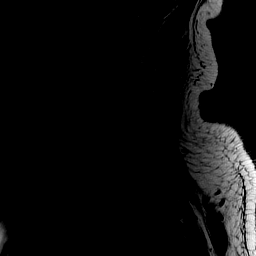
[im 7/13]
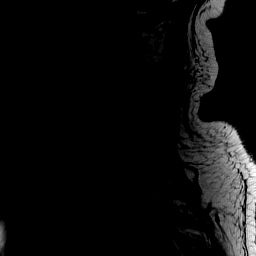
[im 9/13]
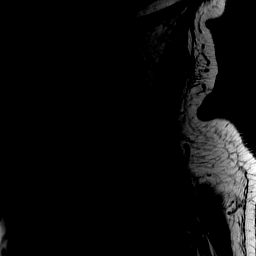
[im 11/13]
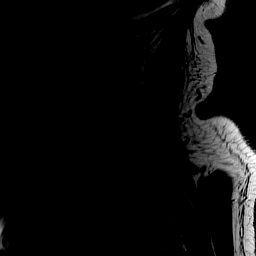
[im 13/13]
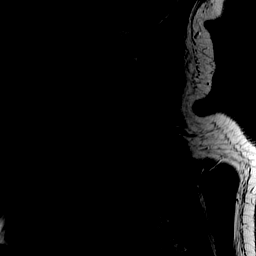

[Series 5: T2 · axial · 3.0mm · 0.70mm/px · z∈[-66,+31]mm · 8 of 27 slices shown (2 of 2)]
[im 1/27]
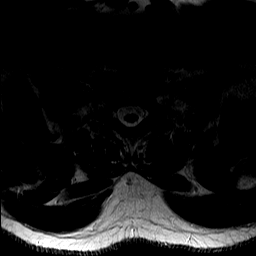
[im 5/27]
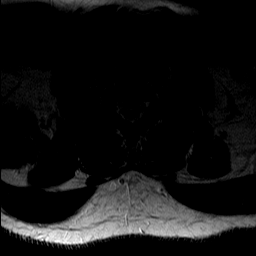
[im 9/27]
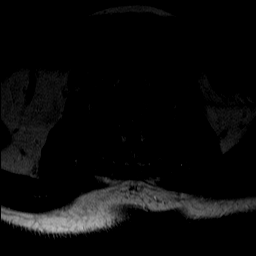
[im 13/27]
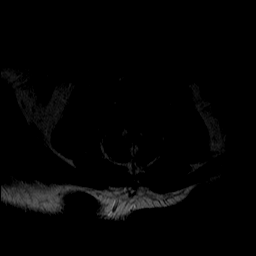
[im 15/27]
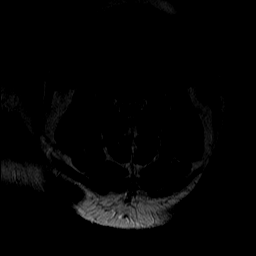
[im 19/27]
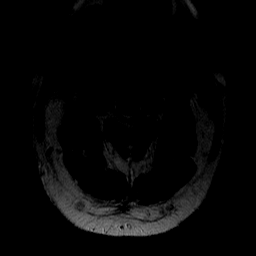
[im 23/27]
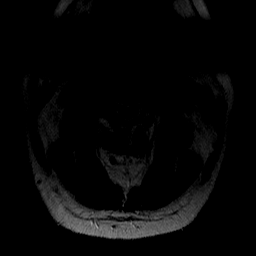
[im 27/27]
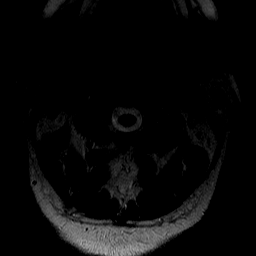

[Series 6: mpgr ax · axial · 3.0mm · 0.35mm/px · z∈[-66,-14]mm · 5 of 27 slices shown]
[im 1/27]
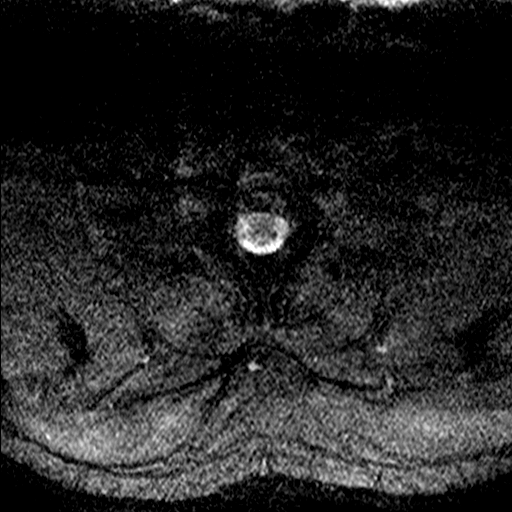
[im 5/27]
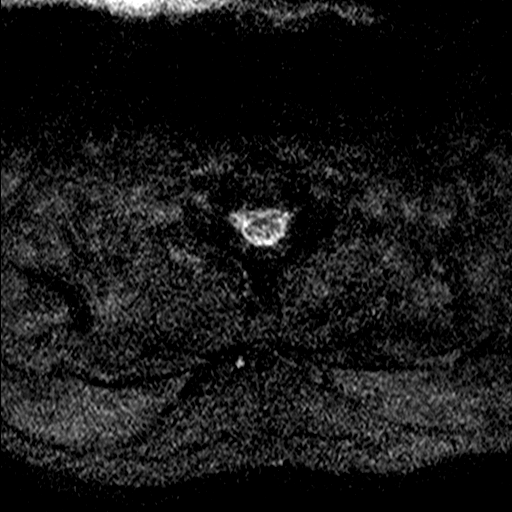
[im 9/27]
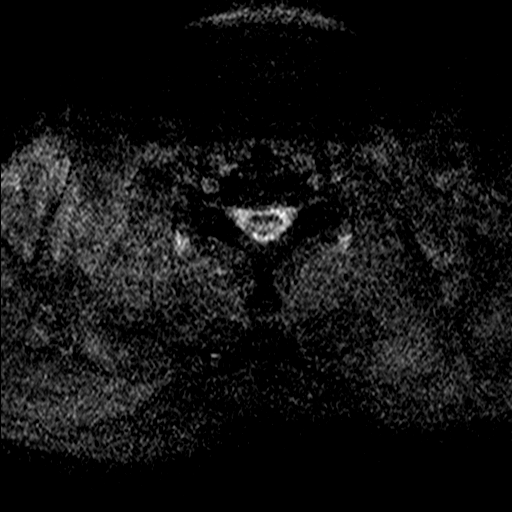
[im 13/27]
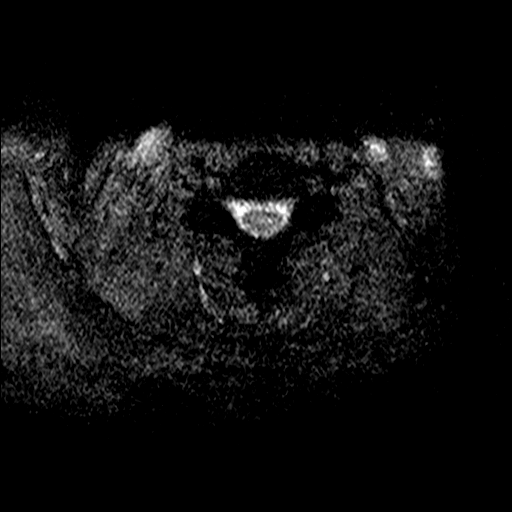
[im 15/27]
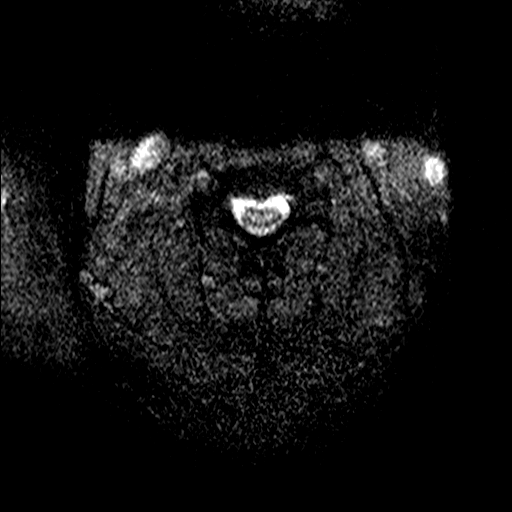

[33 of 48 positions shown; findings below may reference images not displayed]

FINDINGS: The study suffers from pronounced motion degradation.

Alignment: Normal

Vertebrae: No primary bone finding.

Cord: No cord compression or primary cord lesion.

Posterior Fossa, vertebral arteries, paraspinal tissues: Limited
visualization, negative

Disc levels:

No abnormality at the foramen magnum, C1-2, C2-3 or C3-4.

C4-5:  Minimal uncovertebral hypertrophy.  No compressive stenosis.

C5-6: Endplate osteophytes and protruding disc material more
prominent towards the left. Narrowing of the ventral subarachnoid
space but no compression of the cord. Foraminal stenosis on the left
that could affect the C6 nerve.

C6-7: Spondylosis with endplate osteophytes and protruding disc
material. Narrowing of the ventral subarachnoid space but no
compression of the cord. Bilateral foraminal narrowing that could
affect either C7 nerve.

C7-T1: Biforaminal encroachment by osteophyte and disc material
could affect either C8 nerve root. No central canal stenosis.

T1-2:  Normal.
IMPRESSION: Severely motion degraded exam.  Limited diagnostic utility.

C5-6: Spondylosis with left foraminal stenosis that could affect the
C6 nerve.

C6-7: Spondylosis with bilateral foraminal narrowing that could
affect either C7 nerve.

C7-T1: Spondylosis with bilateral foraminal narrowing that could
affect either C8 nerve.

## 2019-11-23 DIAGNOSIS — R2689 Other abnormalities of gait and mobility: Secondary | ICD-10-CM | POA: Diagnosis not present

## 2019-11-23 DIAGNOSIS — S0990XS Unspecified injury of head, sequela: Secondary | ICD-10-CM | POA: Diagnosis not present

## 2019-11-23 DIAGNOSIS — F0781 Postconcussional syndrome: Secondary | ICD-10-CM | POA: Diagnosis not present

## 2019-11-30 DIAGNOSIS — F4323 Adjustment disorder with mixed anxiety and depressed mood: Secondary | ICD-10-CM | POA: Diagnosis not present

## 2019-12-16 DIAGNOSIS — F0781 Postconcussional syndrome: Secondary | ICD-10-CM | POA: Diagnosis not present

## 2019-12-16 DIAGNOSIS — S0990XS Unspecified injury of head, sequela: Secondary | ICD-10-CM | POA: Diagnosis not present

## 2019-12-16 DIAGNOSIS — R2689 Other abnormalities of gait and mobility: Secondary | ICD-10-CM | POA: Diagnosis not present

## 2019-12-16 DIAGNOSIS — G44329 Chronic post-traumatic headache, not intractable: Secondary | ICD-10-CM | POA: Diagnosis not present

## 2019-12-21 DIAGNOSIS — F4323 Adjustment disorder with mixed anxiety and depressed mood: Secondary | ICD-10-CM | POA: Diagnosis not present

## 2019-12-29 DIAGNOSIS — F4323 Adjustment disorder with mixed anxiety and depressed mood: Secondary | ICD-10-CM | POA: Diagnosis not present

## 2020-01-04 DIAGNOSIS — F4323 Adjustment disorder with mixed anxiety and depressed mood: Secondary | ICD-10-CM | POA: Diagnosis not present

## 2020-01-10 DIAGNOSIS — Z1211 Encounter for screening for malignant neoplasm of colon: Secondary | ICD-10-CM | POA: Diagnosis not present

## 2020-01-10 DIAGNOSIS — Z23 Encounter for immunization: Secondary | ICD-10-CM | POA: Diagnosis not present

## 2020-01-10 DIAGNOSIS — Z6828 Body mass index (BMI) 28.0-28.9, adult: Secondary | ICD-10-CM | POA: Diagnosis not present

## 2020-01-10 DIAGNOSIS — F329 Major depressive disorder, single episode, unspecified: Secondary | ICD-10-CM | POA: Diagnosis not present

## 2020-01-11 DIAGNOSIS — F4323 Adjustment disorder with mixed anxiety and depressed mood: Secondary | ICD-10-CM | POA: Diagnosis not present

## 2020-01-12 NOTE — Patient Outreach (Signed)
Received a referral from [facility] has [employer] [facility]	[place]. Following their workflow I have sent this referral to the [facility] Care Management group through email [email or web address] for Care Management.   Thank you

## 2020-01-25 DIAGNOSIS — F4323 Adjustment disorder with mixed anxiety and depressed mood: Secondary | ICD-10-CM | POA: Diagnosis not present

## 2020-02-01 DIAGNOSIS — F4323 Adjustment disorder with mixed anxiety and depressed mood: Secondary | ICD-10-CM | POA: Diagnosis not present

## 2020-02-15 DIAGNOSIS — F4323 Adjustment disorder with mixed anxiety and depressed mood: Secondary | ICD-10-CM | POA: Diagnosis not present

## 2020-02-21 DIAGNOSIS — F0781 Postconcussional syndrome: Secondary | ICD-10-CM | POA: Diagnosis not present

## 2020-02-21 DIAGNOSIS — G44329 Chronic post-traumatic headache, not intractable: Secondary | ICD-10-CM | POA: Diagnosis not present

## 2020-02-22 DIAGNOSIS — F4323 Adjustment disorder with mixed anxiety and depressed mood: Secondary | ICD-10-CM | POA: Diagnosis not present

## 2020-02-29 DIAGNOSIS — F4323 Adjustment disorder with mixed anxiety and depressed mood: Secondary | ICD-10-CM | POA: Diagnosis not present

## 2020-03-07 DIAGNOSIS — F4323 Adjustment disorder with mixed anxiety and depressed mood: Secondary | ICD-10-CM | POA: Diagnosis not present

## 2020-03-14 DIAGNOSIS — F4323 Adjustment disorder with mixed anxiety and depressed mood: Secondary | ICD-10-CM | POA: Diagnosis not present

## 2020-03-28 DIAGNOSIS — F4323 Adjustment disorder with mixed anxiety and depressed mood: Secondary | ICD-10-CM | POA: Diagnosis not present

## 2020-03-30 DIAGNOSIS — R1011 Right upper quadrant pain: Secondary | ICD-10-CM | POA: Diagnosis not present

## 2020-03-30 DIAGNOSIS — E119 Type 2 diabetes mellitus without complications: Secondary | ICD-10-CM | POA: Diagnosis not present

## 2020-03-30 DIAGNOSIS — Z1211 Encounter for screening for malignant neoplasm of colon: Secondary | ICD-10-CM | POA: Diagnosis not present

## 2020-03-30 DIAGNOSIS — Q438 Other specified congenital malformations of intestine: Secondary | ICD-10-CM | POA: Diagnosis not present

## 2020-03-30 DIAGNOSIS — K640 First degree hemorrhoids: Secondary | ICD-10-CM | POA: Diagnosis not present

## 2020-04-04 DIAGNOSIS — F4323 Adjustment disorder with mixed anxiety and depressed mood: Secondary | ICD-10-CM | POA: Diagnosis not present

## 2020-04-11 DIAGNOSIS — F4323 Adjustment disorder with mixed anxiety and depressed mood: Secondary | ICD-10-CM | POA: Diagnosis not present

## 2020-04-26 DIAGNOSIS — S069X0A Unspecified intracranial injury without loss of consciousness, initial encounter: Secondary | ICD-10-CM | POA: Diagnosis not present

## 2020-04-26 DIAGNOSIS — G44329 Chronic post-traumatic headache, not intractable: Secondary | ICD-10-CM | POA: Diagnosis not present

## 2020-04-26 DIAGNOSIS — F0781 Postconcussional syndrome: Secondary | ICD-10-CM | POA: Diagnosis not present

## 2020-05-01 DIAGNOSIS — E119 Type 2 diabetes mellitus without complications: Secondary | ICD-10-CM | POA: Diagnosis not present

## 2020-05-01 DIAGNOSIS — F489 Nonpsychotic mental disorder, unspecified: Secondary | ICD-10-CM | POA: Diagnosis not present

## 2020-05-01 DIAGNOSIS — R109 Unspecified abdominal pain: Secondary | ICD-10-CM | POA: Diagnosis not present

## 2020-05-01 DIAGNOSIS — K5900 Constipation, unspecified: Secondary | ICD-10-CM | POA: Diagnosis not present

## 2020-05-02 DIAGNOSIS — F4323 Adjustment disorder with mixed anxiety and depressed mood: Secondary | ICD-10-CM | POA: Diagnosis not present

## 2020-05-09 DIAGNOSIS — F4323 Adjustment disorder with mixed anxiety and depressed mood: Secondary | ICD-10-CM | POA: Diagnosis not present

## 2020-05-16 DIAGNOSIS — F332 Major depressive disorder, recurrent severe without psychotic features: Secondary | ICD-10-CM | POA: Diagnosis not present

## 2020-05-16 DIAGNOSIS — F4310 Post-traumatic stress disorder, unspecified: Secondary | ICD-10-CM | POA: Diagnosis not present

## 2020-05-16 DIAGNOSIS — F0781 Postconcussional syndrome: Secondary | ICD-10-CM | POA: Diagnosis not present

## 2020-05-23 DIAGNOSIS — F4323 Adjustment disorder with mixed anxiety and depressed mood: Secondary | ICD-10-CM | POA: Diagnosis not present

## 2020-05-30 DIAGNOSIS — F329 Major depressive disorder, single episode, unspecified: Secondary | ICD-10-CM | POA: Diagnosis not present

## 2020-05-30 DIAGNOSIS — E119 Type 2 diabetes mellitus without complications: Secondary | ICD-10-CM | POA: Diagnosis not present

## 2020-06-15 ENCOUNTER — Ambulatory Visit: Payer: PPO | Admitting: Urology

## 2020-06-15 ENCOUNTER — Encounter: Payer: Self-pay | Admitting: Urology

## 2020-06-15 ENCOUNTER — Other Ambulatory Visit: Payer: Self-pay

## 2020-06-15 VITALS — BP 127/84 | HR 116 | Ht 71.0 in | Wt 203.0 lb

## 2020-06-15 DIAGNOSIS — N529 Male erectile dysfunction, unspecified: Secondary | ICD-10-CM

## 2020-06-15 DIAGNOSIS — Z125 Encounter for screening for malignant neoplasm of prostate: Secondary | ICD-10-CM

## 2020-06-15 DIAGNOSIS — R351 Nocturia: Secondary | ICD-10-CM

## 2020-06-15 DIAGNOSIS — E291 Testicular hypofunction: Secondary | ICD-10-CM

## 2020-06-15 MED ORDER — TADALAFIL 20 MG PO TABS: 20.0000 mg | ORAL_TABLET | Freq: Every day | ORAL | 0 refills | Status: AC | PRN

## 2020-06-15 NOTE — Progress Notes (Signed)
06/15/2020 1:56 PM   [facility] 03-31-1966 [id number]  Referring provider: Hillery Aldo, MD [address] [place],  [place] [address]  Chief Complaint  Patient presents with  . Erectile Dysfunction    HPI: 55 y.o. male presents for prostate cancer screening and evaluation of erectile dysfunction.   Family history prostate cancer  PSA 06/21/2019 0.8  Recently placed on Lexapro for depression and has noted significant decrease in his libido, difficulty achieving and maintaining an erection and delayed ejaculation  Also has nocturia x4-5; no bothersome daytime symptoms; + snoring  Additional organic risk factors include hypertension, diabetes  No previous tobacco history   PMH: Past Medical History:  Diagnosis Date  . Anxiety   . Depression   . Diabetes mellitus without complication (HCC)   . Hypertension     Surgical History: Past Surgical History:  Procedure Laterality Date  . APPENDECTOMY      Home Medications:  Allergies as of 06/15/2020   No Known Allergies     Medication List       Accurate as of June 15, 2020  1:56 PM. If you have any questions, ask your nurse or doctor.        STOP taking these medications   eletriptan 20 MG tablet Commonly known as: RELPAX Stopped by: Riki Altes, MD   gabapentin 100 MG capsule Commonly known as: NEURONTIN Stopped by: Riki Altes, MD   glipiZIDE 5 MG 24 hr tablet Commonly known as: GLUCOTROL XL Stopped by: Riki Altes, MD   mirtazapine 15 MG tablet Commonly known as: REMERON Stopped by: Riki Altes, MD     TAKE these medications   enalapril-hydrochlorothiazide 10-25 MG tablet Commonly known as: VASERETIC Take by mouth.   escitalopram 5 MG tablet Commonly known as: LEXAPRO Take one tablet (5mg ) once a day for 7 days and then increase to 2 tablets (10 mg ) once a day   hydrochlorothiazide 25 MG tablet Commonly known as: HYDRODIURIL Take 1 tablet (25 mg  total) by mouth daily.   ibuprofen 800 MG tablet Commonly known as: ADVIL Take by mouth.   Magnesium 200 MG Tabs Take by mouth.   metFORMIN 500 MG 24 hr tablet Commonly known as: GLUCOPHAGE-XR Take 1,000 mg by mouth 2 (two) times daily.   multivitamin with minerals Tabs tablet Take 1 tablet by mouth daily.   omega-3 acid ethyl esters 1 g capsule Commonly known as: LOVAZA Take 2 g by mouth daily.       Allergies: No Known Allergies  Family History: History reviewed. No pertinent family history.  Social History:  reports that he has never smoked. He has never used smokeless tobacco. He reports that he does not drink alcohol and does not use drugs.   Physical Exam: BP 127/84   Pulse (!) 116   Ht 5\' 11"  (1.803 m)   Wt 203 lb (92.1 kg)   BMI 28.31 kg/m   Constitutional:  Alert and oriented, No acute distress. HEENT: [place] AT, moist mucus membranes.  Trachea midline, no masses. Cardiovascular: No clubbing, cyanosis, or edema. Respiratory: Normal respiratory effort, no increased work of breathing. GU: Prostate 35 g, smooth without nodules Skin: No rashes, bruises or suspicious lesions. Neurologic: Grossly intact, no focal deficits, moving all 4 extremities. Psychiatric: Normal mood and affect.   Assessment & Plan:    1.  Erectile dysfunction  Notes significant decreased libido and will schedule a.m. lab visit for testosterone/LH  We discussed  that delayed ejaculation is a common side effect of SSRI  medications and if bothersome he may need to talk to his prescribing provider about a medication change  He was interested in a trial of PDE 5 inhibitor for his ED and Rx tadalafil 20 mg sent to pharmacy  2.  Prostate cancer screening  Benign DRE  PSA to be drawn after 06/20/2020  3.  Nocturia  No other lower urinary tract symptoms  He has a snoring history and may be secondary to sleep apnea   Riki Altes, MD  [facility] [address] [place], [place] [address] [phone]

## 2020-06-18 ENCOUNTER — Encounter: Payer: Self-pay | Admitting: Urology

## 2020-06-20 DIAGNOSIS — F4323 Adjustment disorder with mixed anxiety and depressed mood: Secondary | ICD-10-CM | POA: Diagnosis not present

## 2020-06-27 ENCOUNTER — Other Ambulatory Visit: Payer: Self-pay | Admitting: Family Medicine

## 2020-06-27 DIAGNOSIS — F332 Major depressive disorder, recurrent severe without psychotic features: Secondary | ICD-10-CM | POA: Diagnosis not present

## 2020-06-27 DIAGNOSIS — F4323 Adjustment disorder with mixed anxiety and depressed mood: Secondary | ICD-10-CM | POA: Diagnosis not present

## 2020-06-27 DIAGNOSIS — Z125 Encounter for screening for malignant neoplasm of prostate: Secondary | ICD-10-CM

## 2020-06-27 DIAGNOSIS — F4310 Post-traumatic stress disorder, unspecified: Secondary | ICD-10-CM | POA: Diagnosis not present

## 2020-06-28 ENCOUNTER — Other Ambulatory Visit: Payer: Self-pay

## 2020-06-28 ENCOUNTER — Other Ambulatory Visit: Payer: PPO

## 2020-06-28 DIAGNOSIS — Z125 Encounter for screening for malignant neoplasm of prostate: Secondary | ICD-10-CM

## 2020-06-28 DIAGNOSIS — N529 Male erectile dysfunction, unspecified: Secondary | ICD-10-CM

## 2020-06-29 ENCOUNTER — Telehealth: Payer: Self-pay | Admitting: *Deleted

## 2020-06-29 LAB — PSA: Prostate Specific Ag, Serum: 1 ng/mL (ref 0.0–4.0)

## 2020-06-29 LAB — TESTOSTERONE: Testosterone: 315 ng/dL (ref 264–916)

## 2020-06-29 LAB — LUTEINIZING HORMONE: LH: 5.3 m[IU]/mL (ref 1.7–8.6)

## 2020-06-29 NOTE — Telephone Encounter (Signed)
-----   Message from Riki Altes, MD sent at 06/29/2020  8:31 AM EST ----- Testosterone was in the low normal range.  Please add on a free testosterone level.  LH was normal

## 2020-06-29 NOTE — Telephone Encounter (Signed)
Called and added testosterone free

## 2020-06-30 ENCOUNTER — Encounter: Payer: Self-pay | Admitting: *Deleted

## 2020-06-30 LAB — TESTOSTERONE FREE, PROFILE I
Albumin: 4.8 g/dL (ref 3.8–4.9)
Sex Hormone Binding: 35.8 nmol/L (ref 19.3–76.4)
Testost., Free, Calc: 54.7 pg/mL (ref 35.8–168.2)
Testosterone: 307 ng/dL (ref 264–916)

## 2020-06-30 LAB — SPECIMEN STATUS REPORT

## 2020-07-11 DIAGNOSIS — F4323 Adjustment disorder with mixed anxiety and depressed mood: Secondary | ICD-10-CM | POA: Diagnosis not present

## 2020-07-18 DIAGNOSIS — F4323 Adjustment disorder with mixed anxiety and depressed mood: Secondary | ICD-10-CM | POA: Diagnosis not present

## 2020-07-21 DIAGNOSIS — F332 Major depressive disorder, recurrent severe without psychotic features: Secondary | ICD-10-CM | POA: Diagnosis not present

## 2020-07-21 DIAGNOSIS — F4310 Post-traumatic stress disorder, unspecified: Secondary | ICD-10-CM | POA: Diagnosis not present

## 2020-07-25 DIAGNOSIS — F4323 Adjustment disorder with mixed anxiety and depressed mood: Secondary | ICD-10-CM | POA: Diagnosis not present

## 2020-07-31 DIAGNOSIS — Z794 Long term (current) use of insulin: Secondary | ICD-10-CM | POA: Diagnosis not present

## 2020-07-31 DIAGNOSIS — H524 Presbyopia: Secondary | ICD-10-CM | POA: Diagnosis not present

## 2020-07-31 DIAGNOSIS — E109 Type 1 diabetes mellitus without complications: Secondary | ICD-10-CM | POA: Diagnosis not present

## 2020-07-31 DIAGNOSIS — H52223 Regular astigmatism, bilateral: Secondary | ICD-10-CM | POA: Diagnosis not present

## 2020-07-31 DIAGNOSIS — H5213 Myopia, bilateral: Secondary | ICD-10-CM | POA: Diagnosis not present

## 2020-08-01 DIAGNOSIS — F4323 Adjustment disorder with mixed anxiety and depressed mood: Secondary | ICD-10-CM | POA: Diagnosis not present

## 2020-08-08 DIAGNOSIS — F4323 Adjustment disorder with mixed anxiety and depressed mood: Secondary | ICD-10-CM | POA: Diagnosis not present

## 2020-08-15 DIAGNOSIS — F4323 Adjustment disorder with mixed anxiety and depressed mood: Secondary | ICD-10-CM | POA: Diagnosis not present

## 2020-08-18 DIAGNOSIS — F4310 Post-traumatic stress disorder, unspecified: Secondary | ICD-10-CM | POA: Diagnosis not present

## 2020-08-18 DIAGNOSIS — F332 Major depressive disorder, recurrent severe without psychotic features: Secondary | ICD-10-CM | POA: Diagnosis not present

## 2020-08-22 DIAGNOSIS — F4323 Adjustment disorder with mixed anxiety and depressed mood: Secondary | ICD-10-CM | POA: Diagnosis not present

## 2020-08-29 DIAGNOSIS — F4323 Adjustment disorder with mixed anxiety and depressed mood: Secondary | ICD-10-CM | POA: Diagnosis not present

## 2020-09-05 DIAGNOSIS — F4323 Adjustment disorder with mixed anxiety and depressed mood: Secondary | ICD-10-CM | POA: Diagnosis not present

## 2020-09-12 DIAGNOSIS — F4323 Adjustment disorder with mixed anxiety and depressed mood: Secondary | ICD-10-CM | POA: Diagnosis not present

## 2020-09-19 DIAGNOSIS — Z13220 Encounter for screening for lipoid disorders: Secondary | ICD-10-CM | POA: Diagnosis not present

## 2020-09-19 DIAGNOSIS — I10 Essential (primary) hypertension: Secondary | ICD-10-CM | POA: Diagnosis not present

## 2020-09-19 DIAGNOSIS — E119 Type 2 diabetes mellitus without complications: Secondary | ICD-10-CM | POA: Diagnosis not present

## 2020-09-19 DIAGNOSIS — K009 Disorder of tooth development, unspecified: Secondary | ICD-10-CM | POA: Diagnosis not present

## 2020-09-19 DIAGNOSIS — Z6827 Body mass index (BMI) 27.0-27.9, adult: Secondary | ICD-10-CM | POA: Diagnosis not present

## 2020-09-19 DIAGNOSIS — L910 Hypertrophic scar: Secondary | ICD-10-CM | POA: Diagnosis not present

## 2022-11-25 ENCOUNTER — Other Ambulatory Visit: Payer: Self-pay

## 2022-11-25 ENCOUNTER — Encounter: Payer: Self-pay | Admitting: Emergency Medicine

## 2022-11-25 ENCOUNTER — Emergency Department
Admission: EM | Admit: 2022-11-25 | Discharge: 2022-11-25 | Disposition: A | Discharge status: Home / Self Care | Payer: Medicare HMO | Source: Home / Self Care | Attending: Student in an Organized Health Care Education/Training Program | Admitting: Student in an Organized Health Care Education/Training Program

## 2022-11-25 ENCOUNTER — Emergency Department: Payer: Medicare HMO

## 2022-11-25 DIAGNOSIS — R109 Unspecified abdominal pain: Secondary | ICD-10-CM

## 2022-11-25 DIAGNOSIS — F419 Anxiety disorder, unspecified: Secondary | ICD-10-CM | POA: Insufficient documentation

## 2022-11-25 DIAGNOSIS — R110 Nausea: Secondary | ICD-10-CM | POA: Insufficient documentation

## 2022-11-25 LAB — COMPREHENSIVE METABOLIC PANEL
ALT: 11 U/L (ref 0–44)
AST: 17 U/L (ref 15–41)
Albumin: 4.3 g/dL (ref 3.5–5.0)
Alkaline Phosphatase: 50 U/L (ref 38–126)
Anion gap: 10 (ref 5–15)
BUN: 31 mg/dL — ABNORMAL HIGH (ref 6–20)
CO2: 28 mmol/L (ref 22–32)
Calcium: 9.7 mg/dL (ref 8.9–10.3)
Chloride: 101 mmol/L (ref 98–111)
Creatinine, Ser: 0.91 mg/dL (ref 0.61–1.24)
GFR, Estimated: >60 mL/min (ref >60)
Glucose, Bld: 150 mg/dL — ABNORMAL HIGH (ref 70–99)
Potassium: 3.2 mmol/L — ABNORMAL LOW (ref 3.5–5.1)
Sodium: 139 mmol/L (ref 135–145)
Total Bilirubin: 0.9 mg/dL (ref 0.3–1.2)
Total Protein: 7.5 g/dL (ref 6.5–8.1)

## 2022-11-25 LAB — CBC
HCT: 38.1 % — ABNORMAL LOW (ref 39.0–52.0)
Hemoglobin: 13 g/dL (ref 13.0–17.0)
MCH: 30.6 pg (ref 26.0–34.0)
MCHC: 34.1 g/dL (ref 30.0–36.0)
MCV: 89.6 fL (ref 80.0–100.0)
Platelets: 275 10*3/uL (ref 150–400)
RBC: 4.25 MIL/uL (ref 4.22–5.81)
RDW: 12 % (ref 11.5–15.5)
WBC: 4.9 10*3/uL (ref 4.0–10.5)
nRBC: 0 % (ref 0.0–0.2)

## 2022-11-25 LAB — URINALYSIS, ROUTINE W REFLEX MICROSCOPIC
Bacteria, UA: NONE SEEN
Bilirubin Urine: NEGATIVE
Glucose, UA: NEGATIVE mg/dL
Ketones, ur: NEGATIVE mg/dL
Leukocytes,Ua: NEGATIVE
Nitrite: NEGATIVE
Protein, ur: 30 mg/dL — AB
Specific Gravity, Urine: 1.026 (ref 1.005–1.030)
Squamous Epithelial / HPF: NONE SEEN /HPF (ref 0–5)
pH: 5 (ref 5.0–8.0)

## 2022-11-25 LAB — LIPASE, BLOOD: Lipase: 53 U/L — ABNORMAL HIGH (ref 11–51)

## 2022-11-25 MED ORDER — SODIUM CHLORIDE 0.9 % IV BOLUS
500.0000 mL | Freq: Once | INTRAVENOUS | Status: AC
  Administered 2022-11-25: 500 mL via INTRAVENOUS

## 2022-11-25 MED ORDER — OXYCODONE-ACETAMINOPHEN 5-325 MG PO TABS
1.0000 | ORAL_TABLET | ORAL | 0 refills | Status: AC | PRN
Start: 2022-11-25 — End: 2023-11-25

## 2022-11-25 MED ORDER — MORPHINE SULFATE (PF) 4 MG/ML IV SOLN: 4.0000 mg | INTRAVENOUS | Status: DC | PRN

## 2022-11-25 MED ORDER — KETOROLAC TROMETHAMINE 15 MG/ML IJ SOLN
15.0000 mg | Freq: Once | INTRAMUSCULAR | Status: AC
  Administered 2022-11-25: 15 mg via INTRAVENOUS
  Filled 2022-11-25: qty 1

## 2022-11-25 MED ORDER — ONDANSETRON HCL 4 MG/2ML IJ SOLN
4.0000 mg | Freq: Once | INTRAMUSCULAR | Status: AC
  Administered 2022-11-25: 4 mg via INTRAVENOUS
  Filled 2022-11-25: qty 2

## 2022-11-25 NOTE — ED Provider Notes (Signed)
[facility] Provider Note    Event Date/Time   First MD Initiated Contact with Patient 11/25/22 1234     (approximate)   History   Back Pain   HPI  Vincent Colon is a 57 y.o. male presents the ER for evaluation of several days of progressively worsening right flank pain radiating to the right lower quadrant.  He is status post appendectomy has not noticed any hematuria has noticed a little bit darker colored urine.  No fevers or chills.  Has had some nausea.  Feels anxious.  Denies any heavy lifting or injury.  No known history of kidney stones.     Physical Exam   Triage Vital Signs: ED Triage Vitals  Encounter Vitals Group     BP 11/25/22 1106 (!) 138/93     Systolic BP Percentile --      Diastolic BP Percentile --      Pulse Rate 11/25/22 1106 (!) 115     Resp 11/25/22 1106 14     Temp 11/25/22 1106 98.6 F (37 C)     Temp Source 11/25/22 1106 Oral     SpO2 11/25/22 1106 97 %     Weight 11/25/22 1108 203 lb 0.7 oz (92.1 kg)     Height 11/25/22 1108 5\' 11"  (1.803 m)     Head Circumference --      Peak Flow --      Pain Score 11/25/22 1108 10     Pain Loc --      Pain Education --      Exclude from Growth Chart --     Most recent vital signs: Vitals:   11/25/22 1106  BP: (!) 138/93  Pulse: (!) 115  Resp: 14  Temp: 98.6 F (37 C)  SpO2: 97%     Constitutional: Alert  Eyes: Conjunctivae are normal.  Head: Atraumatic. Nose: No congestion/rhinnorhea. Mouth/Throat: Mucous membranes are moist.   Neck: Painless ROM.  Cardiovascular:   Good peripheral circulation. Respiratory: Normal respiratory effort.  No retractions.  Gastrointestinal: Soft and nontender.  Musculoskeletal:  no deformity Neurologic:  MAE spontaneously. No gross focal neurologic deficits are appreciated.  Skin:  Skin is warm, dry and intact. No rash noted.    ED Results / Procedures / Treatments   Labs (all labs ordered are listed, but only abnormal  results are displayed) Labs Reviewed  LIPASE, BLOOD - Abnormal; Notable for the following components:      Result Value   Lipase 53 (*)    All other components within normal limits  COMPREHENSIVE METABOLIC PANEL - Abnormal; Notable for the following components:   Potassium 3.2 (*)    Glucose, Bld 150 (*)    BUN 31 (*)    All other components within normal limits  CBC - Abnormal; Notable for the following components:   HCT 38.1 (*)    All other components within normal limits  URINALYSIS, ROUTINE W REFLEX MICROSCOPIC - Abnormal; Notable for the following components:   Color, Urine YELLOW (*)    APPearance HAZY (*)    Hgb urine dipstick MODERATE (*)    Protein, ur 30 (*)    All other components within normal limits     EKG     RADIOLOGY Please see ED Course for my review and interpretation.  I personally reviewed all radiographic images ordered to evaluate for the above acute complaints and reviewed radiology reports and findings.  These findings were personally discussed with the  patient.  Please see medical record for radiology report.    PROCEDURES:  Critical Care performed: No  Procedures   MEDICATIONS ORDERED IN ED: Medications  morphine (PF) 4 MG/ML injection 4 mg (has no administration in time range)  ondansetron (ZOFRAN) injection 4 mg (4 mg Intravenous Given 11/25/22 1316)  ketorolac (TORADOL) 15 MG/ML injection 15 mg (15 mg Intravenous Given 11/25/22 1317)  sodium chloride 0.9 % bolus 500 mL (500 mLs Intravenous New Bag/Given 11/25/22 1320)     IMPRESSION / MDM / ASSESSMENT AND PLAN / ED COURSE  I reviewed the triage vital signs and the nursing notes.                              Differential diagnosis includes, but is not limited to, stone, pyelo-, colitis, diverticulitis, musculoskeletal strain,  Patient presenting to the ER for evaluation of symptoms as described above.  Based on symptoms, risk factors and considered above differential, this presenting  complaint could reflect a potentially life-threatening illness therefore the patient will be placed on continuous pulse oximetry and telemetry for monitoring.  Laboratory evaluation will be sent to evaluate for the above complaints.  Suspect kidney stone versus musculoskeletal strain.  His abdominal exam is soft benign.  Will order CT imaging will give IV pain medication.  Doubt infectious process.  No reported trauma.   Clinical Course as of 11/25/22 1407  Mon Nov 25, 2022  1334 CT imaging on my review and interpretation without evidence of obstruction.  Suspect right distal stone.  Will await formal radiology report. [PR]  1407 No acute findings.  No right-sided stones there is left-sided stent.  Possible recent passed stone with some renal colic.  Probable musculoskeletal related pain.  Will give short prescription for pain medication.  Do not feel the patient requires hospitalization.  Does appear appropriate for outpatient follow-up. [PR]    Clinical Course User Index [PR] Willy Eddy, MD     FINAL CLINICAL IMPRESSION(S) / ED DIAGNOSES   Final diagnoses:  Flank pain, acute     Rx / DC Orders   ED Discharge Orders          Ordered    oxyCODONE-acetaminophen (PERCOCET) 5-325 MG tablet  Every 4 hours PRN        11/25/22 1405             Note:  This document was prepared using Dragon voice recognition software and may include unintentional dictation errors.    Willy Eddy, MD 11/25/22 [id number]

## 2022-11-25 NOTE — ED Triage Notes (Signed)
Pt here with lower back pain and RLQ abd pain. Pt states the pain has been there still last week getting worse. Pt denies NVD but also denies having an appetite. Pt denies fevers as well. Pt states he does not have an appendix.

## 2022-12-25 ENCOUNTER — Ambulatory Visit: Payer: Medicare HMO | Admitting: Urology

## 2022-12-25 ENCOUNTER — Encounter: Payer: Self-pay | Admitting: Urology

## 2022-12-25 VITALS — BP 118/80 | HR 99 | Ht 71.0 in | Wt 193.7 lb

## 2022-12-25 DIAGNOSIS — Z125 Encounter for screening for malignant neoplasm of prostate: Secondary | ICD-10-CM

## 2022-12-25 DIAGNOSIS — Z8042 Family history of malignant neoplasm of prostate: Secondary | ICD-10-CM

## 2022-12-25 DIAGNOSIS — R3129 Other microscopic hematuria: Secondary | ICD-10-CM

## 2022-12-25 DIAGNOSIS — R109 Unspecified abdominal pain: Secondary | ICD-10-CM | POA: Diagnosis not present

## 2022-12-25 DIAGNOSIS — R972 Elevated prostate specific antigen [PSA]: Secondary | ICD-10-CM

## 2022-12-25 DIAGNOSIS — R310 Gross hematuria: Secondary | ICD-10-CM

## 2022-12-25 NOTE — Progress Notes (Signed)
I, Duke Salvia, acting as a Neurosurgeon for Riki Altes, MD., have documented all relevant documentation on the behalf of Riki Altes, MD, as directed by  Riki Altes, MD while in the presence of Riki Altes, MD.  12/25/2022 9:52 PM   Vincent Colon [facility] 1965-09-17 [id number]  Referring provider: [facility] [address] [place],  [place] [address]  Chief Complaint  Patient presents with   Erectile Dysfunction    HPI: 57 y.o. male presenting for follow up of a recent ED visit.  Last seen by me in February 2022 for prostate cancer screening and erectile dysfunction; family history of prostate cancer. He had a benign DRE and PSA was 1.0. Last PSA 08/2021 was 0.59. He presented to the ED 11/25/22 with a one week history of right flank pain radiating to the right lower quadrant and darker colored urine. Urinalysis showed 11/20 RBCs. A CT renal stone study was performed, which showed a punctate non-obstructing left renal calculus and prostate enlargement. He was concerned about his prostate enlargement, which is the reason he made the appointment. He still has right flank pain.   PMH: Past Medical History:  Diagnosis Date   Anxiety    Depression    Diabetes mellitus without complication (HCC)    Hypertension     Surgical History: Past Surgical History:  Procedure Laterality Date   APPENDECTOMY      Home Medications:  Allergies as of 12/25/2022   No Known Allergies      Medication List        Accurate as of December 25, 2022  9:52 PM. If you have any questions, ask your nurse or doctor.          enalapril-hydrochlorothiazide 10-25 MG tablet Commonly known as: VASERETIC Take by mouth.   escitalopram 5 MG tablet Commonly known as: LEXAPRO Take one tablet (5mg ) once a day for 7 days and then increase to 2 tablets (10 mg ) once a day   gabapentin 100 MG capsule Commonly known as: NEURONTIN Take by mouth.   hydrochlorothiazide 25  MG tablet Commonly known as: HYDRODIURIL Take 1 tablet (25 mg total) by mouth daily.   hydrOXYzine 25 MG tablet Commonly known as: ATARAX Take by mouth.   ibuprofen 800 MG tablet Commonly known as: ADVIL Take by mouth.   Magnesium 200 MG Tabs Take by mouth.   metFORMIN 500 MG 24 hr tablet Commonly known as: GLUCOPHAGE-XR Take 1,000 mg by mouth 2 (two) times daily.   multivitamin with minerals Tabs tablet Take 1 tablet by mouth daily.   omega-3 acid ethyl esters 1 g capsule Commonly known as: LOVAZA Take 2 g by mouth daily.   oxyCODONE-acetaminophen 5-325 MG tablet Commonly known as: Percocet Take 1 tablet by mouth every 4 (four) hours as needed for severe pain.   prazosin 5 MG capsule Commonly known as: MINIPRESS Take by mouth.   tadalafil 20 MG tablet Commonly known as: CIALIS Take 1 tablet (20 mg total) by mouth daily as needed for erectile dysfunction.         Social History:  reports that he has never smoked. He has never used smokeless tobacco. He reports that he does not drink alcohol and does not use drugs.   Physical Exam: BP 118/80   Pulse 99   Ht 5\' 11"  (1.803 m)   Wt 193 lb 11.2 oz (87.9 kg)   BMI 27.02 kg/m   Constitutional:  Alert and  oriented, No acute distress. HEENT: [place] AT, moist mucus membranes.  Trachea midline, no masses. Cardiovascular: No clubbing, cyanosis, or edema. Respiratory: Normal respiratory effort, no increased work of breathing. GU: Prostate 40 g, smooth without nodules Skin: No rashes, bruises or suspicious lesions. Neurologic: Grossly intact, no focal deficits, moving all 4 extremities. Psychiatric: Normal mood and affect.  Pertinent Imaging:  CT scan was personally reviewed and interpreted. Prostate volume was calculated at 45 cc. There is some median lobe enlargement. There are calcifications in the region of the iliacs and crossing of the ureter and do not feel a medial calculus can be excluded.  Narrative &  Impression  CLINICAL DATA:  One-week history of worsening lower back and right lower quadrant abdominal pain   EXAM: CT ABDOMEN AND PELVIS WITHOUT CONTRAST   TECHNIQUE: Multidetector CT imaging of the abdomen and pelvis was performed following the standard protocol without IV contrast.   RADIATION DOSE REDUCTION: This exam was performed according to the departmental dose-optimization program which includes automated exposure control, adjustment of the mA and/or kV according to patient size and/or use of iterative reconstruction technique.   COMPARISON:  CT abdomen and pelvis dated 04/14/2005   FINDINGS: Lower chest: No focal consolidation or pulmonary nodule in the lung bases. No pleural effusion or pneumothorax demonstrated. Partially imaged heart size is normal.   Hepatobiliary: The most superior aspect of the liver is not included within the field of view. No focal hepatic lesions. No intra or extrahepatic biliary ductal dilation. Mild hyperattenuation of the gallbladder fundus may reflect fundal adenomyomatosis.   Pancreas: No focal lesions or main ductal dilation.   Spleen: Normal in size without focal abnormality.   Adrenals/Urinary Tract: No adrenal nodules. No suspicious renal mass or hydronephrosis. Punctate nonobstructing left interpolar stone. No focal bladder wall thickening.   Stomach/Bowel: Normal appearance of the stomach. No evidence of bowel wall thickening, distention, or inflammatory changes. Appendix is absent.   Vascular/Lymphatic: Aortic atherosclerosis. No enlarged abdominal or pelvic lymph nodes.   Reproductive: Enlargement of the prostate with median lobe hypertrophy.   Other: No free fluid, fluid collection, or free air.   Musculoskeletal: No acute or abnormal lytic or blastic osseous lesions. Bilateral L3 pars interarticularis defects. Degenerative changes at L5-S1.   IMPRESSION: 1. No acute abnormality in the abdomen or pelvis. 2.  Punctate nonobstructing left interpolar renal stone. 3. Enlargement of the prostate with median lobe hypertrophy. 4.  Aortic Atherosclerosis (ICD10-I70.0).     Electronically Signed   By: Agustin Cree M.D.   On: 11/25/2022 13:43     Assessment & Plan:    1. Right flank pain Significant microhematuria and calcifications in the vicinity of the ureteral crossing of the iliacs. Recommend CT urogram for further evaluation of ureteral calculus and other potential causes of microhematuria if no calculus identified. He was informed if a calculus is not identified that a cystoscopy will be recommended.  2. Microhematuria As above.  3. Family history, prostate cancer screening Benign DRE.     [facility] [address], [address] [place], [place] [address] [phone]

## 2022-12-26 ENCOUNTER — Encounter: Payer: Self-pay | Admitting: Urology

## 2022-12-26 LAB — URINALYSIS, COMPLETE
Bilirubin, UA: NEGATIVE
Ketones, UA: NEGATIVE
Leukocytes,UA: NEGATIVE
Nitrite, UA: NEGATIVE
Protein,UA: NEGATIVE
Specific Gravity, UA: 1.02 (ref 1.005–1.030)
Urobilinogen, Ur: 0.2 mg/dL (ref 0.2–1.0)
pH, UA: 6 (ref 5.0–7.5)

## 2022-12-26 LAB — MICROSCOPIC EXAMINATION

## 2022-12-26 LAB — PSA: Prostate Specific Ag, Serum: 0.7 ng/mL (ref 0.0–4.0)

## 2023-01-02 ENCOUNTER — Ambulatory Visit
Admission: RE | Admit: 2023-01-02 | Discharge: 2023-01-02 | Disposition: A | Discharge status: Home / Self Care | Payer: Medicare HMO | Source: Ambulatory Visit | Attending: Urology | Admitting: Urology

## 2023-01-02 DIAGNOSIS — R3129 Other microscopic hematuria: Secondary | ICD-10-CM | POA: Insufficient documentation

## 2023-01-02 MED ORDER — IOHEXOL 300 MG/ML  SOLN
100.0000 mL | Freq: Once | INTRAMUSCULAR | Status: AC | PRN
  Administered 2023-01-02: 100 mL via INTRAVENOUS

## 2023-01-07 ENCOUNTER — Encounter: Payer: Self-pay | Admitting: Urology

## 2023-02-19 ENCOUNTER — Ambulatory Visit: Payer: Medicare HMO | Admitting: Urology

## 2023-02-19 VITALS — BP 128/71 | HR 102 | Ht 71.0 in | Wt 195.0 lb

## 2023-02-19 DIAGNOSIS — R3129 Other microscopic hematuria: Secondary | ICD-10-CM

## 2023-02-19 LAB — URINALYSIS, COMPLETE
Bilirubin, UA: NEGATIVE
Glucose, UA: NEGATIVE
Leukocytes,UA: NEGATIVE
Nitrite, UA: NEGATIVE
Specific Gravity, UA: 1.02 (ref 1.005–1.030)
Urobilinogen, Ur: 1 mg/dL (ref 0.2–1.0)
pH, UA: 7 (ref 5.0–7.5)

## 2023-02-19 LAB — MICROSCOPIC EXAMINATION

## 2023-02-19 NOTE — Progress Notes (Signed)
   02/19/23  CC:  Chief Complaint  Patient presents with   Cysto    HPI: Refer to my previous note 12/25/2022.  CT urogram showed no upper tract abnormalities.  There was a punctate nonobstructing calculus in the left kidney.  No ureteral calculi were seen.  His pain has resolved. UA 11*30 RBC  Blood pressure 128/71, pulse (!) 102, height 5\' 11"  (1.803 m), weight 195 lb (88.5 kg). NED. A&Ox3.   No respiratory distress   Abd soft, NT, ND Normal phallus with bilateral descended testicles  Cystoscopy Procedure Note  Patient identification was confirmed, informed consent was obtained, and patient was prepped using Betadine solution.  Lidocaine jelly was administered per urethral meatus.     Pre-Procedure: - Inspection reveals a normal caliber urethral meatus.  Procedure: The flexible cystoscope was introduced without difficulty - No urethral strictures/lesions are present. -Prominent lateral lobe enlargement with hypervascularity and friability prostate  - Elevated bladder neck - Bilateral ureteral orifices identified - Bladder mucosa  reveals no ulcers, tumors, or lesions - No bladder stones -Mild trabeculation  Retroflexion shows backbleeding from the prostatic urethra and a moderate intravesical median lobe   Post-Procedure: - Patient tolerated the procedure well  Assessment/ Plan: No mucosal abnormalities on cystoscopy BPH with hypervascularity; he has no bothersome lower urinary tract symptoms 1 year follow-up with UA    Riki Altes, MD

## 2023-02-20 ENCOUNTER — Encounter: Payer: Self-pay | Admitting: Urology

## 2023-02-22 ENCOUNTER — Encounter: Payer: Self-pay | Admitting: Urology

## 2023-11-17 ENCOUNTER — Encounter: Payer: Self-pay | Admitting: Urology

## 2024-02-17 ENCOUNTER — Encounter: Payer: Self-pay | Admitting: Urology

## 2024-02-17 ENCOUNTER — Ambulatory Visit: Admitting: Urology

## 2024-02-17 VITALS — BP 135/89 | HR 81 | Ht 71.0 in | Wt 192.0 lb

## 2024-02-17 DIAGNOSIS — R3129 Other microscopic hematuria: Secondary | ICD-10-CM

## 2024-02-17 DIAGNOSIS — Z125 Encounter for screening for malignant neoplasm of prostate: Secondary | ICD-10-CM | POA: Diagnosis not present

## 2024-02-17 DIAGNOSIS — R6882 Decreased libido: Secondary | ICD-10-CM

## 2024-02-17 LAB — MICROSCOPIC EXAMINATION: Bacteria, UA: NONE SEEN

## 2024-02-17 LAB — URINALYSIS, COMPLETE
Bilirubin, UA: NEGATIVE
Glucose, UA: NEGATIVE
Ketones, UA: NEGATIVE
Leukocytes,UA: NEGATIVE
Nitrite, UA: NEGATIVE
Protein,UA: NEGATIVE
Specific Gravity, UA: <1.005 — ABNORMAL LOW (ref 1.005–1.030)
Urobilinogen, Ur: 0.2 mg/dL (ref 0.2–1.0)
pH, UA: 6 (ref 5.0–7.5)

## 2024-02-17 NOTE — Progress Notes (Signed)
 02/17/2024 1:56 PM   Vincent Colon [facility] [birth year] [id number]  Referring provider: [facility] [address] [place],  [place] [address]  Chief Complaint  Patient presents with   Other   Urologic history:  1.  Microhematuria Cystoscopy 02/19/2023 with prominent lateral lobe enlargement/hypervascularity/friability.  Intravesical median lobe CTU with punctate nonobstructing left renal calculus  2.  Erectile dysfunction   HPI: Vincent Colon is a 58 y.o. male presents for annual follow-up  Since last year's visit denies gross hematuria, bothersome lower urinary tract symptoms He has noted significant decrease in his libido.  No tiredness or fatigue.  Testosterone  levels in 2022 were low normal in the low 300 range  PMH: Past Medical History:  Diagnosis Date   Anxiety    Depression    Diabetes mellitus without complication (HCC)    Hypertension     Surgical History: Past Surgical History:  Procedure Laterality Date   APPENDECTOMY      Home Medications:  Allergies as of 02/17/2024       Reactions   Gramineae Pollens         Medication List        Accurate as of February 17, 2024  1:56 PM. If you have any questions, ask your nurse or doctor.          enalapril-hydrochlorothiazide  10-25 MG tablet Commonly known as: VASERETIC Take by mouth.   escitalopram 5 MG tablet Commonly known as: LEXAPRO Take one tablet (5mg ) once a day for 7 days and then increase to 2 tablets (10 mg ) once a day   hydrochlorothiazide  25 MG tablet Commonly known as: HYDRODIURIL  Take 1 tablet (25 mg total) by mouth daily.   hydrOXYzine  25 MG tablet Commonly known as: ATARAX  Take by mouth.   ibuprofen  800 MG tablet Commonly known as: ADVIL  Take by mouth.   Magnesium  200 MG Tabs Take by mouth.   metFORMIN  500 MG 24 hr tablet Commonly known as: GLUCOPHAGE -XR Take 1,000 mg by mouth 2 (two) times daily.   multivitamin with minerals Tabs tablet Take  1 tablet by mouth daily.   omega-3 acid ethyl esters 1 g capsule Commonly known as: LOVAZA Take 2 g by mouth daily.   prazosin 5 MG capsule Commonly known as: MINIPRESS Take by mouth.   tadalafil  20 MG tablet Commonly known as: CIALIS  Take 1 tablet (20 mg total) by mouth daily as needed for erectile dysfunction.        Allergies:  Allergies  Allergen Reactions   Gramineae Pollens     Family History: No family history on file.  Social History:  reports that he has never smoked. He has never used smokeless tobacco. He reports that he does not drink alcohol and does not use drugs.   Physical Exam: BP 135/89   Pulse 81   Ht 5' 11 (1.803 m)   Wt 192 lb (87.1 kg)   BMI 26.78 kg/m   Constitutional:  Alert, No acute distress. HEENT: Vincent Colon AT Respiratory: Normal respiratory effort, no increased work of breathing. GU: Prostate 40 g, smooth without nodules Psychiatric: Normal mood and affect.  Laboratory Data:  Urinalysis Dipstick 1+ blood Microscopy 3-10 RBC  Assessment & Plan:    1. Microhematuria Stable  2.  Low libido Schedule a.m. visit testosterone  level  3.  Prostate cancer screening Benign DRE PSA at time of testosterone  level  Continue annual follow-up  [name], MD  [facility] [place] [address]  [address] [place], [place] [address] [phone]

## 2024-02-19 ENCOUNTER — Ambulatory Visit: Payer: Self-pay | Admitting: Urology

## 2024-02-24 ENCOUNTER — Other Ambulatory Visit

## 2024-02-24 DIAGNOSIS — R6882 Decreased libido: Secondary | ICD-10-CM

## 2024-02-24 DIAGNOSIS — Z125 Encounter for screening for malignant neoplasm of prostate: Secondary | ICD-10-CM

## 2024-02-25 LAB — PSA: Prostate Specific Ag, Serum: 1.4 ng/mL (ref 0.0–4.0)

## 2024-02-25 LAB — TESTOSTERONE: Testosterone: 518 ng/dL (ref 264–916)

## 2024-02-26 ENCOUNTER — Ambulatory Visit: Payer: Self-pay | Admitting: Urology

## 2025-02-08 ENCOUNTER — Ambulatory Visit: Admitting: Urology
